# Patient Record
Sex: Female | Born: 1985 | Hispanic: Yes | Marital: Single | Smoking: Never smoker
Health system: Southern US, Community
[De-identification: ages and names within clinical notes are randomized; demographics above are authoritative.]

## PROBLEM LIST (undated history)

## (undated) ENCOUNTER — Inpatient Hospital Stay (HOSPITAL_COMMUNITY): Payer: Self-pay

## (undated) DIAGNOSIS — Z789 Other specified health status: Secondary | ICD-10-CM

## (undated) HISTORY — PX: NO PAST SURGERIES: SHX2092

---

## 2017-04-10 ENCOUNTER — Inpatient Hospital Stay (HOSPITAL_COMMUNITY)
Admission: AD | Admit: 2017-04-10 | Discharge: 2017-04-10 | Disposition: A | Discharge status: Home / Self Care | Payer: Medicaid Other | Source: Ambulatory Visit | Attending: Obstetrics & Gynecology | Admitting: Obstetrics & Gynecology

## 2017-04-10 ENCOUNTER — Encounter (HOSPITAL_COMMUNITY): Payer: Self-pay | Admitting: *Deleted

## 2017-04-10 DIAGNOSIS — R51 Headache: Secondary | ICD-10-CM | POA: Insufficient documentation

## 2017-04-10 DIAGNOSIS — Z3A15 15 weeks gestation of pregnancy: Secondary | ICD-10-CM | POA: Insufficient documentation

## 2017-04-10 DIAGNOSIS — R519 Headache, unspecified: Secondary | ICD-10-CM

## 2017-04-10 DIAGNOSIS — O26892 Other specified pregnancy related conditions, second trimester: Secondary | ICD-10-CM | POA: Insufficient documentation

## 2017-04-10 HISTORY — DX: Other specified health status: Z78.9

## 2017-04-10 LAB — URINALYSIS, ROUTINE W REFLEX MICROSCOPIC
Bilirubin Urine: NEGATIVE
GLUCOSE, UA: NEGATIVE mg/dL
HGB URINE DIPSTICK: NEGATIVE
Ketones, ur: NEGATIVE mg/dL
Leukocytes, UA: NEGATIVE
Nitrite: NEGATIVE
PROTEIN: NEGATIVE mg/dL
Specific Gravity, Urine: 1.019 (ref 1.005–1.030)
pH: 5 (ref 5.0–8.0)

## 2017-04-10 MED ORDER — ACETAMINOPHEN 500 MG PO TABS
1000.0000 mg | ORAL_TABLET | Freq: Once | ORAL | Status: AC
  Administered 2017-04-10: 1000 mg via ORAL
  Filled 2017-04-10: qty 2

## 2017-04-10 NOTE — MAU Provider Note (Signed)
History     CSN: [id number]  Arrival date and time: 04/10/17 [id number]   First Provider Initiated Contact with Patient 04/10/17 [phone]      Chief Complaint  Patient presents with  . Headache   HPI Ms. Lisa Hodge is a 31 y.o. G1P0 at 372w0d who presents to MAU today with complaint of headache x 1.5 weeks. She rates the pain at 5/10 now. She was taking Tylenol 1 G with good relief last week, but has not continued to take any since. She denies vaginal discharge, bleeding or abdominal pain today. She is requesting an [place] to determine the sex of the baby. [place]. [place]    OB History    Gravida Para Term Preterm AB Living   1             SAB TAB Ectopic Multiple Live Births                  Past Medical History:  Diagnosis Date  . Medical history non-contributory     Past Surgical History:  Procedure Laterality Date  . NO PAST SURGERIES      History reviewed. No pertinent family history.  Social History   Tobacco Use  . Smoking status: Never Smoker  . Smokeless tobacco: Never Used  Substance Use Topics  . Alcohol use: No    Frequency: Never  . Drug use: No    Allergies: No Known Allergies  No medications prior to admission.    Review of Systems  Constitutional: Negative for fever.  Gastrointestinal: Negative for abdominal pain, constipation, diarrhea, nausea and vomiting.  Genitourinary: Negative for dysuria, frequency, urgency, vaginal bleeding and vaginal discharge.  Neurological: Positive for headaches.   Physical Exam   Blood pressure 101/63, pulse 85, temperature 98.8 F (37.1 C), temperature source Oral, resp. rate 16, weight 140 lb 1.3 oz (63.5 kg), last menstrual period 12/26/2016, SpO2 99 %.  Physical Exam  Nursing note and vitals reviewed. Constitutional: She is oriented to person, place, and time. She appears well-developed and  well-nourished. No distress.  HENT:  Head: Normocephalic and atraumatic.  Cardiovascular: Normal rate.  Respiratory: Effort normal.  GI: Soft. She exhibits no distension. There is no tenderness.  Neurological: She is alert and oriented to person, place, and time.  Skin: Skin is warm and dry. No erythema.  Psychiatric: She has a normal mood and affect.     Results for orders placed or performed during the hospital encounter of 04/10/17 (from the past 24 hour(s))  Urinalysis, Routine w reflex microscopic     Status: None   Collection Time: 04/10/17  8:51 AM  Result Value Ref Range   Color, Urine YELLOW YELLOW   APPearance CLEAR CLEAR   Specific Gravity, Urine 1.019 1.005 - 1.030   pH 5.0 5.0 - 8.0   Glucose, UA NEGATIVE NEGATIVE mg/dL   Hgb urine dipstick NEGATIVE NEGATIVE   Bilirubin Urine NEGATIVE NEGATIVE   Ketones, ur NEGATIVE NEGATIVE mg/dL   Protein, ur NEGATIVE NEGATIVE mg/dL   Nitrite NEGATIVE NEGATIVE   Leukocytes, UA NEGATIVE NEGATIVE    MAU Course  Procedures None  MDM FHR - 161 bpm with doppler  1 G Tylenol given in MAU Assessment and Plan  A: SIUP at 672w0d Headache   P: Discharge home Advised Tylenol PRN for pain Second trimester precautions discussed Patient referred to  [facility] to start prenatal care. They will call her with an appointment.  Patient may return to MAU as needed or if her condition were to change or worsen  Vonzella NippleJulie Rukia Mcgillivray, PA-C 04/10/2017, 10:48 AM

## 2017-04-10 NOTE — MAU Note (Signed)
+  Headache X1.5 weeks Has been taking medication she got from her country; unsure of medication name Rating pain 7-8/10  LMP 12/26/16  New to area; will copy records--mau provider notified

## 2017-04-10 NOTE — Discharge Instructions (Signed)
Cefalea tensional (Tension Headache) Una cefalea tensional es una sensacin de dolor o presin que suele manifestarse en la frente y los lados de la cabeza. Este es el tipo ms comn de dolor de Turkmenistancabeza. El dolor puede ser sordo o puede sentirse que comprime (constrictivo). Generalmente, no se asocia con nuseas o vmitos y no empeora con la actividad fsica. Las cefaleas tensionales pueden durar de 30 minutos a [address]varios das. CAUSAS Se desconoce la causa exacta de esta afeccin. Suelen comenzar despus de una situacin de estrs, ansiedad o por depresin. Otros factores desencadenantes pueden ser los siguientes:  Alcohol.  Demasiada cafena o abstinencia de cafena.  Infecciones respiratorias, como resfriados, gripes o sinusitis.  Problemas dentales o apretar los dientes.  Fatiga.  Mantener la cabeza y el cuello en la misma posicin durante un perodo prolongado, por ejemplo, al usar la computadora.  Fumar. SNTOMAS Los sntomas de esta afeccin incluyen lo siguiente:  Sensacin de presin alrededor de la cabeza.  Dolor "sordo" en la cabeza.  Dolor que siente sobre la frente y los lados de la cabeza.  Dolor a la Albertson'spalpacin en los msculos de la cabeza, del cuello y de los hombros. DIAGNSTICO Esta afeccin se puede diagnosticar en funcin de los sntomas y de un examen fsico. Pueden hacerle estudios, como una tomografa computarizada o una resonancia magntica de la cabeza. Estos estudios se indican si los sntomas son graves o fuera de lo comn. TRATAMIENTO Esta afeccin puede tratarse con cambios en el estilo de vida y medicamentos que lo ayuden a Paramedicaliviar los sntomas. INSTRUCCIONES PARA EL CUIDADO EN EL HOGAR Control del Reynolds Americandolor  Tome los medicamentos de venta libre y los recetados solamente como se lo haya indicado el mdico.  Cuando sienta dolor de cabeza acustese en un cuarto oscuro y tranquilo.  Si se lo indican, aplique hielo sobre la cabeza y la zona del cuello: ? Ponga  el hielo en una bolsa plstica. ? Coloque una FirstEnergy Corptoalla entre la piel y la bolsa de hielo. ? Coloque el hielo durante 20minutos, 2 a 3veces por da.  Utilice una almohadilla trmica o tome una ducha con agua caliente para aplicar calor en la cabeza y la zona del cuello como se lo haya indicado el mdico. Comida y bebida  Mantenga un horario para las comidas.  Limite el consumo de bebidas alcohlicas.  Disminuya el consumo de cafena o deje de consumir cafena. Instrucciones generales  Concurra a todas las visitas de control como se lo haya indicado el mdico. Esto es importante.  Lleve un diario de los dolores de cabeza para Financial risk analystaveriguar qu factores pueden desencadenarlos. Por ejemplo, escriba los siguientes datos: ? Lo que usted come y bebe. ? Cunto tiempo duerme. ? Algn cambio en su dieta o en los medicamentos.  Pruebe algunas tcnicas de relajacin, como los Chestermasajes.  Limite el estrs.  Sintese derecho y AT&Tevite tensionar los msculos.  No consuma productos que contengan tabaco, incluidos cigarrillos, tabaco de Theatre managermascar o cigarrillos electrnicos. Si necesita ayuda para dejar de fumar, consulte al American Expressmdico.  Haga actividad fsica habitualmente como se lo haya indicado el mdico.  Duerma entre 7 y 9horas o la cantidad de horas que le haya recomendado el mdico. SOLICITE ATENCIN MDICA SI:  Los medicamentos no Materials engineerlogran aliviar los sntomas.  Tiene un dolor de cabeza que es diferente del dolor de cabeza habitual.  Tiene nuseas o vmitos.  Tiene fiebre. SOLICITE ATENCIN MDICA DE INMEDIATO SI:  El dolor se hace cada vez ms intenso.  Ha vomitado repetidas veces.  Presenta rigidez en el cuello.  Sufre prdida de la visin.  Tiene problemas para hablar.  Siente dolor en el ojo o en el odo.  Presenta debilidad muscular o prdida del control muscular.  Pierde el equilibrio o tiene problemas para Advertising account planner.  Sufre mareos o se desmaya.  Se siente confundido. Esta  informacin no tiene Theme park manager el consejo del mdico. Asegrese de hacerle al mdico cualquier pregunta que tenga. Document Released: 01/17/2005 Document Revised: 12/29/2014 Elsevier Interactive Patient Education  2017 [employer]itor. [place] trimestre de Psychiatrist (Second Trimester of Pregnancy) El segundo trimestre va desde la semana13 hasta la 28, desde el cuarto hasta el sexto mes, y suele ser el momento en el que mejor se siente. En general, las nuseas matutinas han disminuido o han desaparecido completamente. Tendr ms energa y podr aumentarle el apetito. El beb por nacer (feto) se desarrolla rpidamente. Hacia el final del sexto mes, el beb mide aproximadamente 9 pulgadas (23 cm) y pesa alrededor de 1 libras (700 g). Es probable que sienta al beb moverse (dar pataditas) entre las 18 y 20 semanas del Psychiatrist. CUIDADOS EN EL HOGAR  No fume, no consuma hierbas ni beba alcohol. No tome frmacos que el mdico no haya autorizado.  No consuma ningn producto que contenga tabaco, lo que incluye cigarrillos, tabaco de Theatre manager o Administrator, Civil Service. Si necesita ayuda para dejar de fumar, consulte al American Express. Puede recibir asesoramiento u otro tipo de apoyo para dejar de fumar.  Tome los medicamentos solamente como se lo haya indicado el mdico. Algunos medicamentos son seguros para tomar durante el Psychiatrist y otros no lo son.  Haga ejercicios solamente como se lo haya indicado el mdico. Interrumpa la actividad fsica si comienza a tener calambres.  Ingiera alimentos saludables de [place] regular.  Use un sostn que le brinde buen soporte si sus mamas estn sensibles.  No se d baos de inmersin en agua caliente, baos turcos ni saunas.  Colquese el cinturn de seguridad cuando conduzca.  No coma carne cruda ni queso sin cocinar; evite el contacto con las bandejas sanitarias de los gatos y la tierra que estos animales usan.  Tome las vitaminas prenatales.  Tome entre  1500 y 2000mg  de calcio diariamente comenzando en la semana20 del embarazo [place].  Pruebe tomar un medicamento que la ayude a defecar (un laxante suave) si el mdico lo autoriza. Consuma ms fibra, que se encuentra en las frutas y verduras frescas y los cereales integrales. Beba suficiente lquido para mantener el pis (orina) claro o de color amarillo plido.  Dese baos de asiento con agua tibia para Engineer, materials o las molestias causadas por las hemorroides. Use una crema para las hemorroides si el mdico la autoriza.  Si se le hinchan las venas (venas varicosas), use medias de descanso. Levante (eleve) los pies durante , 3 o 4veces por Futures trader. Limite el consumo de sal en su dieta.  No levante objetos pesados, use zapatos de tacones bajos y sintese derecha.  Descanse con las piernas elevadas si tiene calambres o dolor de cintura.  Visite a su dentista si no lo ha Occupational hygienist. Use un cepillo de cerdas suaves para cepillarse los dientes. Psese el hilo dental con suavidad.  Puede seguir Calpine Corporation, a menos que el mdico le indique lo contrario.  Concurra a los controles mdicos.  SOLICITE AYUDA SI:  Siente mareos.  Sufre calambres o presin leves en la parte baja  del vientre (abdomen).  Sufre un dolor persistente en el abdomen.  Tiene Programme researcher, broadcasting/film/videomalestar estomacal (nuseas), vmitos, o tiene deposiciones acuosas (diarrea).  Advierte un olor ftido que proviene de la vagina.  Siente dolor al ConocoPhillipsorinar.  SOLICITE AYUDA DE INMEDIATO SI:  Tiene fiebre.  Tiene una prdida de lquido por la vagina.  Tiene sangrado o pequeas prdidas vaginales.  Siente dolor intenso o clicos en el abdomen.  Sube o baja de peso rpidamente.  Tiene dificultades para recuperar el aliento y siente dolor en el pecho.  Sbitamente se le hinchan mucho el rostro, las Sagaponackmanos, los tobillos, los pies o las piernas.  No ha sentido los movimientos del beb  durante Georgianne Fickuna hora.  Siente un dolor de cabeza intenso que no se alivia con medicamentos.  Su visin se modifica.  Esta informacin no tiene Theme park managercomo fin reemplazar el consejo del mdico. Asegrese de hacerle al mdico cualquier pregunta que tenga. Document Released: 12/10/2012 Document Revised: 04/30/2014 Document Reviewed: 06/10/2012 Elsevier Interactive Patient Education  2017 ArvinMeritorElsevier Inc.

## 2017-04-23 NOTE — L&D Delivery Note (Addendum)
Patient is 32 y.o. G1P0 3373w5d admitted for PROM,   Delivery Note At 11:56 AM a viable female was delivered via Vaginal, Spontaneous (Presentation: ROA;  ).  APGAR:pending weight  pending Placenta status: delivered intact, .  Cord:3V  Anesthesia:  Lidocaine 1% Episiotomy: None Lacerations: 2nd degree;Labial Suture Repair: 3.0 vicryl Est. Blood Loss (mL): 200  Mom to postpartum.  Baby to Couplet care / Skin to Skin.  Upon arrival patient was complete and pushing. She pushed with good maternal effort to deliver a healthy baby boy. Baby delivered without difficulty, was noted to have good tone and place on maternal abdomen for oral suctioning, drying and stimulation. Delayed cord clamping performed. Placenta delivered intact with 3V cord. Vaginal canal and perineum was inspected and 2nd degree perineal laceration was repaired; Labial tear and perineal laceration hemostatic. Pitocin was started and uterus massaged until bleeding slowed. Counts of sharps, instruments, and lap pads were all correct.   Garnette GunnerAaron B Thompson, MD PGY-1 6/10/201912:33 PM  I was present for the entire delivery of baby and placenta and performed the repair and agree with above.  Katrinka BlazingSmith, [place], CNM 09/30/2017 4:03 PM

## 2017-05-07 ENCOUNTER — Ambulatory Visit (INDEPENDENT_AMBULATORY_CARE_PROVIDER_SITE_OTHER): Payer: Medicaid Other | Admitting: Family Medicine

## 2017-05-07 ENCOUNTER — Encounter: Payer: Self-pay | Admitting: Family Medicine

## 2017-05-07 DIAGNOSIS — Z124 Encounter for screening for malignant neoplasm of cervix: Secondary | ICD-10-CM

## 2017-05-07 DIAGNOSIS — Z3402 Encounter for supervision of normal first pregnancy, second trimester: Secondary | ICD-10-CM

## 2017-05-07 DIAGNOSIS — Z23 Encounter for immunization: Secondary | ICD-10-CM

## 2017-05-07 DIAGNOSIS — Z3400 Encounter for supervision of normal first pregnancy, unspecified trimester: Secondary | ICD-10-CM | POA: Diagnosis not present

## 2017-05-07 DIAGNOSIS — Z113 Encounter for screening for infections with a predominantly sexual mode of transmission: Secondary | ICD-10-CM

## 2017-05-07 NOTE — Progress Notes (Signed)
Offered Flu shot but Pt has a cold.

## 2017-05-07 NOTE — Progress Notes (Signed)
   PRENATAL VISIT NOTE  Subjective:  Lisa Hodge is a 32 y.o. G1P0 at 8058w6d by LMP with Estimated Date of Delivery: 10/02/17, being seen today for New prenatal visit .  This is her first pregnancy. Denies any concerns today other than she was expecting to have and U/S today.  Patient reports no complaints.  Contractions: Not present. Vag. Bleeding: None.  Movement: Absent. Denies leaking of fluid.   The following portions of the patient's history were reviewed and updated as appropriate: allergies, current medications, past family history, past medical history, past social history, past surgical history and problem list. Problem list updated.  OB History  Gravida Para Term Preterm AB Living  1            SAB TAB Ectopic Multiple Live Births               # Outcome Date GA Lbr Len/2nd Weight Sex Delivery Anes PTL Lv  1 Current              Past Medical History:  Diagnosis Date  . Medical history non-contributory     Past Surgical History:  Procedure Laterality Date  . NO PAST SURGERIES      Objective:   Vitals:   05/07/17 1335  BP: 112/83  Pulse: 97  Weight: 140 lb 11.2 oz (63.8 kg)    Fetal Status: Fetal Heart Rate (bpm): 164   Movement: Absent     Fundal height: below umbulicus  General appearance: Well nourished, well developed female in no acute distress.  Neck:  Supple, normal appearance, and no thyromegaly  Cardiovascular: normal rate, regular rhythm, no murmur Respiratory:  Clear to auscultation bilateral. Normal respiratory effort Abdomen: positive bowel sounds and no masses, hernias; diffusely non tender to palpation, non distended Breasts: breasts appear normal, no suspicious masses, no skin or nipple changes or axillary nodes, not examined, patient declines to have breast exam. Neuro/Psych:  Normal mood and affect.  Skin:  Warm and dry.  Lymphatic:  No inguinal lymphadenopathy.   Pelvic exam: EGBUS: within normal limits, Vagina: within normal  limits and with no blood in the vault, Cervix: normal appearing cervix without discharge or lesions, closed/long/high, Uterus: size appropriate for GA   Assessment and Plan:  Pregnancy: G1P0 at 3458w6d here for new OB visit  1. Supervision of normal first pregnancy, antepartum Initial labs drawn. Continue prenatal vitamins. Genetic Screening discussed, declined Ultrasound discussed; fetal anatomic survey: ordered. Problem list reviewed and updated. The nature [facility] with multiple MDs and other Advanced Practice Providers was explained to patient; also emphasized that residents, students are part of our team. Routine obstetric precautions reviewed. Return in about 4 weeks (around 06/04/2017) for LOB.    Raynelle FanningJulie P. Jaydee Ingman, MD [facility]  Future Appointments  Date Time Provider Department Center  05/13/2017  1:15 PM WH-MFC US 4 WH-MFCUS MFC-US  06/06/2017  1:15 PM Marylene LandKooistra, Kathryn Lorraine, CNM WOC-WOCA WOC  07/04/2017  8:15 AM Marylene LandKooistra, Kathryn Lorraine, CNM WOC-WOCA WOC  07/18/2017  8:15 AM Judeth HornLawrence, Erin, NP [facility]

## 2017-05-08 LAB — GC/CHLAMYDIA PROBE AMP (~~LOC~~) NOT AT ARMC
Chlamydia: NEGATIVE
NEISSERIA GONORRHEA: NEGATIVE

## 2017-05-08 LAB — CYTOLOGY - PAP
ADEQUACY: ABSENT
DIAGNOSIS: NEGATIVE

## 2017-05-10 LAB — URINE CULTURE, OB REFLEX: Organism ID, Bacteria: NO GROWTH

## 2017-05-10 LAB — CULTURE, OB URINE

## 2017-05-13 ENCOUNTER — Ambulatory Visit (HOSPITAL_COMMUNITY)
Admission: RE | Admit: 2017-05-13 | Discharge: 2017-05-13 | Disposition: A | Discharge status: Home / Self Care | Payer: Medicaid Other | Source: Ambulatory Visit | Attending: Family Medicine | Admitting: Family Medicine

## 2017-05-13 ENCOUNTER — Other Ambulatory Visit: Payer: Self-pay | Admitting: Family Medicine

## 2017-05-13 DIAGNOSIS — Z3A19 19 weeks gestation of pregnancy: Secondary | ICD-10-CM | POA: Insufficient documentation

## 2017-05-13 DIAGNOSIS — Z369 Encounter for antenatal screening, unspecified: Secondary | ICD-10-CM

## 2017-05-13 DIAGNOSIS — Z363 Encounter for antenatal screening for malformations: Secondary | ICD-10-CM | POA: Insufficient documentation

## 2017-05-13 DIAGNOSIS — Z3400 Encounter for supervision of normal first pregnancy, unspecified trimester: Secondary | ICD-10-CM

## 2017-05-14 LAB — SMN1 COPY NUMBER ANALYSIS (SMA CARRIER SCREENING)

## 2017-05-14 LAB — CYSTIC FIBROSIS GENE TEST

## 2017-05-14 LAB — OBSTETRIC PANEL, INCLUDING HIV
ANTIBODY SCREEN: NEGATIVE
BASOS: 0 %
Basophils Absolute: 0 10*3/uL (ref 0.0–0.2)
EOS (ABSOLUTE): 0.1 10*3/uL (ref 0.0–0.4)
EOS: 1 %
HEMATOCRIT: 36.5 % (ref 34.0–46.6)
HEMOGLOBIN: 12.4 g/dL (ref 11.1–15.9)
HEP B S AG: NEGATIVE
HIV Screen 4th Generation wRfx: NONREACTIVE
IMMATURE GRANULOCYTES: 0 %
Immature Grans (Abs): 0 10*3/uL (ref 0.0–0.1)
Lymphocytes Absolute: 2.1 10*3/uL (ref 0.7–3.1)
Lymphs: 24 %
MCH: 30.4 pg (ref 26.6–33.0)
MCHC: 34 g/dL (ref 31.5–35.7)
MCV: 90 fL (ref 79–97)
MONOCYTES: 9 %
Monocytes Absolute: 0.8 10*3/uL (ref 0.1–0.9)
NEUTROS PCT: 66 %
Neutrophils Absolute: 6 10*3/uL (ref 1.4–7.0)
Platelets: 242 10*3/uL (ref 150–379)
RBC: 4.08 x10E6/uL (ref 3.77–5.28)
RDW: 13.4 % (ref 12.3–15.4)
RH TYPE: POSITIVE
RPR Ser Ql: NONREACTIVE
RUBELLA: 11.5 {index} (ref >0.99)
WBC: 9.1 10*3/uL (ref 3.4–10.8)

## 2017-05-14 LAB — HEMOGLOBINOPATHY EVALUATION
Ferritin: 66 ng/mL (ref 15–150)
HGB A: 97.6 % (ref 96.4–98.8)
HGB F QUANT: 0 % (ref 0.0–2.0)
HGB S: 0 %
HGB SOLUBILITY: NEGATIVE
Hgb A2 Quant: 2.4 % (ref 1.8–3.2)
Hgb C: 0 %
Hgb Variant: 0 %

## 2017-05-20 ENCOUNTER — Other Ambulatory Visit: Payer: Self-pay | Admitting: Family Medicine

## 2017-05-20 DIAGNOSIS — Z3400 Encounter for supervision of normal first pregnancy, unspecified trimester: Secondary | ICD-10-CM

## 2017-05-28 ENCOUNTER — Encounter: Payer: Self-pay | Admitting: General Practice

## 2017-06-06 ENCOUNTER — Ambulatory Visit (INDEPENDENT_AMBULATORY_CARE_PROVIDER_SITE_OTHER): Payer: Self-pay | Admitting: Student

## 2017-06-06 DIAGNOSIS — Z3400 Encounter for supervision of normal first pregnancy, unspecified trimester: Secondary | ICD-10-CM

## 2017-06-06 NOTE — Progress Notes (Signed)
Stratus interpreter Alba CoryElsa [phone]

## 2017-06-06 NOTE — Patient Instructions (Signed)
--No comer despues de la medianoche el dia antes.  -Planea al estar aqui por dos horas    Prueba de tolerancia a la glucosa durante el embarazo Glucose Tolerance Test During Pregnancy La prueba de tolerancia a la glucosa es un anlisis de sangre que se [place]a para determinar si ha contrado un tipo de diabetes durante el embarazo (diabetes gestacional). Esto es cuando el cuerpo no procesa de forma Arboriculturistcorrecta el azcar (glucosa) en los alimentos que come, lo cual provoca niveles sanguneos altos de glucosa. Por lo general, la PTG se realiza despus de haberse hecho una prueba de glucosa de 1 hora, cuyos resultados indican que posiblemente tiene diabetes gestacional. Tambin se puede hacer si:  Tiene antecedentes de haber parido bebs muy grandes o antecedentes de muerte fetal repetida (beb nacido muerto).  Tiene signos o sntomas de diabetes, tales como: ? Cambios en la visin. ? Hormigueo o adormecimiento en las manos o los pies. ? Cambios en el hambre, la sed y la miccin que no se explican por Firefighterel embarazo.  La PTG dura unas 3 horas. Le darn para beber una solucin de agua y azcar al principio de la prueba. Le extraern sangre antes de que beba la solucin, y 1, 2 y 3 horas despus de beberla. No se le permitir comer ni beber nada ms durante la prueba. Debe Geneticist, molecularpermanecer en el lugar en que se realiza la prueba para asegurarse de que la sangre se extraiga puntualmente. Tambin debe evitar realizar ejercicios durante la prueba porque esto puede AutoZonealterar los resultados. Cmo debo prepararme para esta prueba? Coma normalmente durante 3 das antes de la PTG e incluya muchos alimentos ricos en carbohidratos. No coma ni beba nada, excepto agua, durante las ltimas 12 horas antes de la prueba. Adems, el mdico puede pedirle que deje de tomar ciertos medicamentos antes de la prueba. Qu significan los resultados? Es su responsabilidad retirar el resultado del Algomaestudio. Consulte en el laboratorio o en el  departamento en el que fue realizado el estudio cundo y cmo podr Starbucks Corporationobtener los resultados. Comunquese con el mdico si tiene Smith Internationalpreguntas sobre los resultados. Rango de Circuit Cityvalores normales Los rangos para los valores normales pueden variar entre diferentes laboratorios y hospitales. Siempre debe consultar a su mdico despus de realizarse un anlisis u otros estudios para saber si los valores de sus Clarksresultados se consideran dentro de los lmites normales. Los niveles normales de glucemia son los siguientes:  Ayuno: menos de 105mg /dl.  Una hora despus de beber la solucin: menos de 190mg /dl.  Dos horas despus de beber la solucin: menos de 165mg /dl.  Tres horas despus de beber la solucin: menos de 145mg /dl.  Algunas sustancias pueden AutoZonealterar los resultados de la PTG. Estos pueden incluir lo siguiente:  Medicamentos para la presin arterial y la insuficiencia cardaca, como betabloqueantes, furosemida y tiacidas.  Antiinflamatorios, como aspirina.  La nicotina.  Algunos medicamentos psiquitricos.  Significado de los Ball Corporationresultados que estn fuera de los rangos de los valores normales Los resultados de la PTG por debajo de los valores normales pueden indicar varios problemas de Ridgefieldsalud, tales como:  Diabetes gestacional.  Respuesta al estrs agudo.  Sndrome de [place].  Tumores como feocromocitoma o glucagonoma.  Problemas renales de larga duracin.  Pancreatitis.  Hipertiroidismo.  Infeccin actual.  Hable con el mdico Dole Foodsobre los resultados. El mdico Calpine Corporationutilizar los resultados para Education officer, environmentalrealizar un diagnstico y Chief Strategy Officerdeterminar un plan de tratamiento adecuado para usted. Esta informacin no tiene Theme park managercomo fin reemplazar el consejo del mdico. Asegrese de  hacerle al mdico cualquier pregunta que tenga. Document Released: 10/09/2011 Document Revised: 06/28/2016 Document Reviewed: 08/14/2013 Elsevier Interactive Patient Education  [employer].

## 2017-06-06 NOTE — Progress Notes (Signed)
Patient ID: Lisa Hodge, female   DOB: [birth year], 32 y.o.   MRN: [id number]   PRENATAL VISIT NOTE  Subjective:  Lisa Hodge is a 32 y.o. G1P0 at 3964w1d being seen today for ongoing prenatal care.  She is currently monitored for the following issues for this low-risk pregnancy and has Supervision of normal first pregnancy, antepartum on their problem list.  Patient reports no complaints.  Contractions: Not present. Vag. Bleeding: None.  Movement: Present. Denies leaking of fluid.   The following portions of the patient's history were reviewed and updated as appropriate: allergies, current medications, past family history, past medical history, past social history, past surgical history and problem list. Problem list updated.  Objective:   Vitals:   06/06/17 1322  BP: 100/63  Pulse: (!) 106  Weight: 140 lb 4.8 oz (63.6 kg)    Fetal Status: Fetal Heart Rate (bpm): 156 Fundal Height: 22 cm Movement: Present     General:  Alert, oriented and cooperative. Patient is in no acute distress.  Skin: Skin is warm and dry. No rash noted.   Cardiovascular: Normal heart rate noted  Respiratory: Normal respiratory effort, no problems with respiration noted  Abdomen: Soft, gravid, appropriate for gestational age.  Pain/Pressure: Present     Pelvic: Cervical exam deferred        Extremities: Normal range of motion.  Edema: None  Mental Status:  Normal mood and affect. Normal behavior. Normal judgment and thought content.   Assessment and Plan:  Pregnancy: G1P0 at 5264w1d  1. Supervision of normal first pregnancy, antepartum Doing well; no complaints. Reassured normalcy of non-visualized anatomy and that her anatomy scan was very reassuring.  - US MFM OB FOLLOW UP; Future  Preterm labor symptoms and general obstetric precautions including but not limited to vaginal bleeding, contractions, leaking of fluid and fetal movement were reviewed in detail with the patient. Please  refer to After Visit Summary for other counseling recommendations.  Follow up in 4 weeks. Reviewed protocol for 2 hour GTT, which will be done at next visit.   Marylene LandKathryn Lorraine Catalina Salasar, CNM

## 2017-06-14 ENCOUNTER — Other Ambulatory Visit: Payer: Self-pay | Admitting: Student

## 2017-06-14 ENCOUNTER — Ambulatory Visit (HOSPITAL_COMMUNITY)
Admission: RE | Admit: 2017-06-14 | Discharge: 2017-06-14 | Disposition: A | Discharge status: Home / Self Care | Payer: Medicaid Other | Source: Ambulatory Visit | Attending: Student | Admitting: Student

## 2017-06-14 DIAGNOSIS — Z3400 Encounter for supervision of normal first pregnancy, unspecified trimester: Secondary | ICD-10-CM | POA: Diagnosis present

## 2017-06-14 DIAGNOSIS — Z362 Encounter for other antenatal screening follow-up: Secondary | ICD-10-CM | POA: Diagnosis present

## 2017-06-14 DIAGNOSIS — Z3492 Encounter for supervision of normal pregnancy, unspecified, second trimester: Secondary | ICD-10-CM | POA: Diagnosis not present

## 2017-06-14 DIAGNOSIS — Z3A24 24 weeks gestation of pregnancy: Secondary | ICD-10-CM | POA: Diagnosis not present

## 2017-07-04 ENCOUNTER — Ambulatory Visit (INDEPENDENT_AMBULATORY_CARE_PROVIDER_SITE_OTHER): Payer: Medicaid Other | Admitting: Student

## 2017-07-04 DIAGNOSIS — Z23 Encounter for immunization: Secondary | ICD-10-CM | POA: Diagnosis not present

## 2017-07-04 DIAGNOSIS — Z3400 Encounter for supervision of normal first pregnancy, unspecified trimester: Secondary | ICD-10-CM

## 2017-07-04 DIAGNOSIS — Z3402 Encounter for supervision of normal first pregnancy, second trimester: Secondary | ICD-10-CM

## 2017-07-04 NOTE — Progress Notes (Signed)
Patient ID: Lisa Hodge, female   DOB: May 31, 1985, 32 y.o.   MRN: [id number]   PRENATAL VISIT NOTE  Subjective:  Lisa Hodge is a 32 y.o. G1P0 at 6339w1d being seen today for ongoing prenatal care.  She is currently monitored for the following issues for this low-risk pregnancy and has Supervision of normal first pregnancy, antepartum on their problem list.  Patient reports no complaints. Very excited that her [place] on [place] 15.  Contractions: Not present. Vag. Bleeding: None.  Movement: Present. Denies leaking of fluid.   The following portions of the patient's history were reviewed and updated as appropriate: allergies, current medications, past family history, past medical history, past social history, past surgical history and problem list. Problem list updated.  Objective:   Vitals:   07/04/17 0823  BP: 104/72  Pulse: 100    Fetal Status: Fetal Heart Rate (bpm): 152 Fundal Height: 28 cm Movement: Present     General:  Alert, oriented and cooperative. Patient is in no acute distress.  Skin: Skin is warm and dry. No rash noted.   Cardiovascular: Normal heart rate noted  Respiratory: Normal respiratory effort, no problems with respiration noted  Abdomen: Soft, gravid, appropriate for gestational age.  Pain/Pressure: Absent     Pelvic: Cervical exam deferred        Extremities: Normal range of motion.  Edema: Trace  Mental Status:  Normal mood and affect. Normal behavior. Normal judgment and thought content.   Assessment and Plan:  Pregnancy: G1P0 at 4939w1d  1. Supervision of normal first pregnancy, antepartum  - CBC - Glucose Tolerance, 2 Hours w/1 Hour - HIV antibody - RPR - Tdap vaccine greater than or equal to 7yo IM - US MFM OB FOLLOW UP; Future  Preterm labor symptoms and general obstetric precautions including but not limited to vaginal bleeding, contractions, leaking of fluid and fetal movement were reviewed in detail with  the patient. Please refer to After Visit Summary for other counseling recommendations.  Return in about 2 weeks (around 07/18/2017).   Marylene LandKathryn Lorraine Tobiah Celestine, CNM

## 2017-07-04 NOTE — Progress Notes (Signed)
[facility] Okey Regalarol

## 2017-07-05 LAB — CBC
Hematocrit: 34.1 % (ref 34.0–46.6)
Hemoglobin: 11.2 g/dL (ref 11.1–15.9)
MCH: 31.3 pg (ref 26.6–33.0)
MCHC: 32.8 g/dL (ref 31.5–35.7)
MCV: 95 fL (ref 79–97)
PLATELETS: 195 10*3/uL (ref 150–379)
RBC: 3.58 x10E6/uL — ABNORMAL LOW (ref 3.77–5.28)
RDW: 13.2 % (ref 12.3–15.4)
WBC: 7.1 10*3/uL (ref 3.4–10.8)

## 2017-07-05 LAB — GLUCOSE TOLERANCE, 2 HOURS W/ 1HR
GLUCOSE, 1 HOUR: 110 mg/dL (ref 65–179)
GLUCOSE, 2 HOUR: 87 mg/dL (ref 65–152)
GLUCOSE, FASTING: 80 mg/dL (ref 65–91)

## 2017-07-05 LAB — HIV ANTIBODY (ROUTINE TESTING W REFLEX): HIV Screen 4th Generation wRfx: NONREACTIVE

## 2017-07-05 LAB — RPR: RPR Ser Ql: NONREACTIVE

## 2017-07-17 ENCOUNTER — Telehealth: Payer: Self-pay | Admitting: Obstetrics & Gynecology

## 2017-07-17 NOTE — Telephone Encounter (Signed)
Left detailed message why appt was changed from 07-18-17 to 07-22-2017.

## 2017-07-18 ENCOUNTER — Ambulatory Visit (INDEPENDENT_AMBULATORY_CARE_PROVIDER_SITE_OTHER): Payer: Medicaid Other | Admitting: Obstetrics and Gynecology

## 2017-07-18 ENCOUNTER — Encounter: Payer: Self-pay | Admitting: Student

## 2017-07-18 ENCOUNTER — Other Ambulatory Visit: Payer: Self-pay | Admitting: Student

## 2017-07-18 ENCOUNTER — Encounter: Payer: Self-pay | Admitting: Obstetrics and Gynecology

## 2017-07-18 ENCOUNTER — Other Ambulatory Visit (HOSPITAL_COMMUNITY): Payer: Self-pay | Admitting: *Deleted

## 2017-07-18 ENCOUNTER — Ambulatory Visit (HOSPITAL_COMMUNITY)
Admission: RE | Admit: 2017-07-18 | Discharge: 2017-07-18 | Disposition: A | Discharge status: Home / Self Care | Payer: Medicaid Other | Source: Ambulatory Visit | Attending: Student | Admitting: Student

## 2017-07-18 VITALS — BP 109/76 | HR 108 | Wt 144.9 lb

## 2017-07-18 DIAGNOSIS — Z362 Encounter for other antenatal screening follow-up: Secondary | ICD-10-CM | POA: Diagnosis present

## 2017-07-18 DIAGNOSIS — H0489 Other disorders of lacrimal system: Secondary | ICD-10-CM | POA: Insufficient documentation

## 2017-07-18 DIAGNOSIS — Z3400 Encounter for supervision of normal first pregnancy, unspecified trimester: Secondary | ICD-10-CM

## 2017-07-18 DIAGNOSIS — Z3A29 29 weeks gestation of pregnancy: Secondary | ICD-10-CM | POA: Diagnosis not present

## 2017-07-18 DIAGNOSIS — O359XX0 Maternal care for (suspected) fetal abnormality and damage, unspecified, not applicable or unspecified: Secondary | ICD-10-CM

## 2017-07-18 NOTE — Progress Notes (Signed)
Subjective:  Lisa Hodge is a 32 y.o. G1P0 at 8944w1d being seen today for ongoing prenatal care.  She is currently monitored for the following issues for this low-risk pregnancy and has Supervision of normal first pregnancy, antepartum on their problem list.  Patient reports no complaints.  Contractions: Not present. Vag. Bleeding: None.  Movement: Present. Denies leaking of fluid.   The following portions of the patient's history were reviewed and updated as appropriate: allergies, current medications, past family history, past medical history, past social history, past surgical history and problem list. Problem list updated.  Objective:   Vitals:   07/18/17 0822  BP: 109/76  Pulse: (!) 108  Weight: 144 lb 14.4 oz (65.7 kg)    Fetal Status: Fetal Heart Rate (bpm): 158   Movement: Present     General:  Alert, oriented and cooperative. Patient is in no acute distress.  Skin: Skin is warm and dry. No rash noted.   Cardiovascular: Normal heart rate noted  Respiratory: Normal respiratory effort, no problems with respiration noted  Abdomen: Soft, gravid, appropriate for gestational age. Pain/Pressure: Absent     Pelvic:  Cervical exam deferred        Extremities: Normal range of motion.  Edema: Trace  Mental Status: Normal mood and affect. Normal behavior. Normal judgment and thought content.   Urinalysis:      Assessment and Plan:  Pregnancy: G1P0 at 1244w1d  1. Supervision of normal first pregnancy, antepartum Stable U/S to complete anatomy today  Preterm labor symptoms and general obstetric precautions including but not limited to vaginal bleeding, contractions, leaking of fluid and fetal movement were reviewed in detail with the patient. Please refer to After Visit Summary for other counseling recommendations.  Return in about 2 weeks (around 08/01/2017) for OB visit.   Hermina StaggersErvin, Collie Kittel L, MD

## 2017-07-22 ENCOUNTER — Encounter: Payer: Self-pay | Admitting: Obstetrics & Gynecology

## 2017-08-08 ENCOUNTER — Encounter: Payer: Self-pay | Admitting: General Practice

## 2017-08-08 ENCOUNTER — Ambulatory Visit (INDEPENDENT_AMBULATORY_CARE_PROVIDER_SITE_OTHER): Payer: Medicaid Other | Admitting: Student

## 2017-08-08 VITALS — BP 109/81 | HR 90 | Wt 147.3 lb

## 2017-08-08 DIAGNOSIS — Z3403 Encounter for supervision of normal first pregnancy, third trimester: Secondary | ICD-10-CM

## 2017-08-08 DIAGNOSIS — Z3400 Encounter for supervision of normal first pregnancy, unspecified trimester: Secondary | ICD-10-CM

## 2017-08-08 DIAGNOSIS — O359XX0 Maternal care for (suspected) fetal abnormality and damage, unspecified, not applicable or unspecified: Secondary | ICD-10-CM | POA: Insufficient documentation

## 2017-08-08 NOTE — Progress Notes (Signed)
States is nervous because is first pregnancy and also at US found cyst on baby's eye. Will have follow up us.

## 2017-08-08 NOTE — Progress Notes (Signed)
Patient ID: Lisa Hodge, female   DOB: [birth year], 32 y.o.   MRN: [id number]   PRENATAL VISIT NOTE  Subjective:  Lisa Hodge is a 32 y.o. G1P0 at 3785w1d being seen today for ongoing prenatal care.  She is currently monitored for the following issues for this low-risk pregnancy and has Supervision of normal first pregnancy, antepartum and Known fetal anomaly, antepartum on their problem list.  Patient reports no complaints.  Patient is very anxious and tearful about the fetal lacrimal duct diagnosis.  Contractions: Not present. Vag. Bleeding: None.  Movement: Present. Denies leaking of fluid.   The following portions of the patient's history were reviewed and updated as appropriate: allergies, current medications, past family history, past medical history, past social history, past surgical history and problem list. Problem list updated.  Objective:   Vitals:   08/08/17 1534  BP: 109/81  Pulse: 90  Weight: 147 lb 4.8 oz (66.8 kg)    Fetal Status: Fetal Heart Rate (bpm): 135 Fundal Height: 33 cm Movement: Present     General:  Alert, oriented and cooperative. Patient is in no acute distress.  Skin: Skin is warm and dry. No rash noted.   Cardiovascular: Normal heart rate noted  Respiratory: Normal respiratory effort, no problems with respiration noted  Abdomen: Soft, gravid, appropriate for gestational age.  Pain/Pressure: Present     Pelvic: Cervical exam deferred        Extremities: Normal range of motion.  Edema: Mild pitting, slight indentation  Mental Status: Normal mood and affect. Normal behavior. Normal judgment and thought content.   Assessment and Plan:  Pregnancy: G1P0 at 4685w1d  1. Supervision of normal first pregnancy, antepartum -Discussed circ and birth control; she will talk with her husband. Also discussed pediatricians.   2. Known fetal anomaly, antepartum, single or unspecified fetus -Explained to patient that, in my limited reading on fetal  lacrimal duct cysts, there are many treatment options and we won't know the severity of the problem until the baby is born. Reassured her that we have pediatricians available 24/7 in the hospital should baby require immediate care for the cyst.  I recommended that she schedule an appt with MFM to discuss her concerns of the lacrimal duct cyst if it is still present on US on 4-25.   Preterm labor symptoms and general obstetric precautions including but not limited to vaginal bleeding, contractions, leaking of fluid and fetal movement were reviewed in detail with the patient. Please refer to After Visit Summary for other counseling recommendations.  Return in about 2 weeks (around 08/22/2017), or LROB with KK.  Future Appointments  Date Time Provider Department Center  08/15/2017  2:15 PM WH-MFC US 4 WH-MFCUS MFC-US  08/23/2017  4:15 PM Crisoforo OxfordKooistra, Charlesetta GaribaldiKathryn Lorraine, CNM WOC-WOCA WOC    Charlesetta GaribaldiKathryn Lorraine [place], [place]

## 2017-08-08 NOTE — Patient Instructions (Addendum)
CIRCUMCISION  Circumcision is considered an elective/non-medically necessary procedure. There are many reasons parents decide to have their sons circumsized. During the first year of life circumcised males have a reduced risk of urinary tract infections but after this year the rates between circumcised males and uncircumcised males are the same.  It is safe to have your son circumcised outside of the hospital and the places above perform them regularly.    Places to have your son circumcised:    [facility] [phone] $480 by 4 wks  [facility] [phone] $244 by 4 wks  [facility] [phone] $175 by 2 wks  [facility] [phone] $250 by 7 days [facility] [phone] $269 by 4 wks  These prices sometimes change but are roughly what you can expect to pay. Please call and confirm pricing.   AREA PEDIATRIC/FAMILY PRACTICE PHYSICIANS  [facility] [address], [address] [place], [place]  [address] Phone - [phone]   Fax - [phone]  [facility] [address] [place], [place] [address] Phone - [phone]   Fax - [phone]  [facility] [address] [place], [place]  [address] Phone - [phone]   Fax - [phone]  [facility] [address] [place], [place]  [address] Phone - [phone]   Fax - [phone]  [facility] [address] [place], [place]  [address] Phone - [phone]   Fax - [phone]  [facility] [address], [address] [place], [place]  [address] Phone - [phone]   Fax - [phone]  [facility] [address], [address] [place], [place]  [address] Phone - [phone]   Fax - [phone]  [facility] [address],  [address] [place], [place]  [address] Phone - [phone]   Fax - [phone]  [facility] [address] [place], [place]  [address] Phone - [phone]   Fax - [phone] [facility] [address] [place], [place]  [address] Phone - [phone]   Fax - [phone]  [facility] [address] [place], [place]  [address] Phone - [phone]   Fax - [phone]  [facility] [address], [address] [place], [place]  [address] Phone - [phone]   Fax - [phone]  [facility] [address] [place], [place]  [address] Phone - [phone]   Fax - [phone]  [facility] [address], [address] [place], [place]  [address] Phone - [phone]  [facility] [address] [place], [place]  [address] Phone - [phone]   Fax - [phone]  [facility] [address], [address] [place], [place]  [address] Phone - [phone]   Fax - [phone]  [facility] [address] [place], [place]  [address] Phone - [phone]   Fax - [phone]  [facility] [address] [place], [place]  [address] Phone - [phone]   Fax - [phone]   [facility] [address] [place], [place]  [address] Phone - [phone]   Fax - [phone]  [facility] [address], [address] [place], [place]  [address] Phone - [phone]   Fax - [phone]  [facility] RUBIN [address], [address] [place], [place]  [address] Phone - [phone]   Fax - [phone]  [facility]  [address] [place], [place]  [address] Phone - [phone]   Fax - [phone]  [facility] Alita Chyle [address] [place], [place]  [address] Phone - [phone]   Fax - [phone] Gerarda Fraction [address] [place], [place]  [address] Phone - [phone]   Fax - [phone]  [facility] [address] [place], [place]  [address] Phone - [phone]   Fax - [phone]  [facility] [place] [address] [place], [place]  [address] Phone - [phone]   Fax - [phone]  [facility] Wyvonne Lenz MD [address] [place] [address] Phone [phone]  Fax [phone]

## 2017-08-15 ENCOUNTER — Ambulatory Visit (HOSPITAL_COMMUNITY)
Admission: RE | Admit: 2017-08-15 | Discharge: 2017-08-15 | Disposition: A | Discharge status: Home / Self Care | Payer: Medicaid Other | Source: Ambulatory Visit | Attending: Student | Admitting: Student

## 2017-08-15 ENCOUNTER — Other Ambulatory Visit (HOSPITAL_COMMUNITY): Payer: Self-pay | Admitting: Obstetrics and Gynecology

## 2017-08-15 ENCOUNTER — Encounter (HOSPITAL_COMMUNITY): Payer: Self-pay

## 2017-08-15 DIAGNOSIS — O359XX0 Maternal care for (suspected) fetal abnormality and damage, unspecified, not applicable or unspecified: Secondary | ICD-10-CM

## 2017-08-15 DIAGNOSIS — Z362 Encounter for other antenatal screening follow-up: Secondary | ICD-10-CM | POA: Diagnosis present

## 2017-08-15 DIAGNOSIS — Z3A33 33 weeks gestation of pregnancy: Secondary | ICD-10-CM

## 2017-08-15 DIAGNOSIS — Z3400 Encounter for supervision of normal first pregnancy, unspecified trimester: Secondary | ICD-10-CM

## 2017-08-15 DIAGNOSIS — IMO0002 Reserved for concepts with insufficient information to code with codable children: Secondary | ICD-10-CM

## 2017-08-15 DIAGNOSIS — O358XX0 Maternal care for other (suspected) fetal abnormality and damage, not applicable or unspecified: Secondary | ICD-10-CM | POA: Insufficient documentation

## 2017-08-23 ENCOUNTER — Ambulatory Visit (INDEPENDENT_AMBULATORY_CARE_PROVIDER_SITE_OTHER): Payer: Medicaid Other | Admitting: Student

## 2017-08-23 VITALS — BP 108/78 | HR 93

## 2017-08-23 DIAGNOSIS — Z3400 Encounter for supervision of normal first pregnancy, unspecified trimester: Secondary | ICD-10-CM

## 2017-08-23 DIAGNOSIS — O359XX0 Maternal care for (suspected) fetal abnormality and damage, unspecified, not applicable or unspecified: Secondary | ICD-10-CM

## 2017-08-23 NOTE — Patient Instructions (Signed)
Parto vaginal, cuidados posteriores  Vaginal Delivery, Care After  Siga estas instrucciones durante las prximas semanas. Estas indicaciones le proporcionan informacin acerca de cmo deber cuidarse despus del parto vaginal. Su mdico tambin podr darle indicaciones ms especficas. El tratamiento ha sido planificado segn las prcticas mdicas actuales, pero en algunos casos pueden ocurrir problemas. Llame al mdico si tiene problemas o preguntas.  Qu puedo esperar despus del procedimiento?  Despus de un parto vaginal, es frecuente tener lo siguiente:   Hemorragia leve de la vagina.   Dolor en el abdomen, la vagina y la zona de la piel entre la abertura vaginal y el ano (perineo).   Calambres plvicos.   Fatiga.    Siga estas indicaciones en su casa:  Medicamentos   Tome los medicamentos de venta libre y los recetados solamente como se lo haya indicado el mdico.   Si le recetaron un antibitico, tmelo como se lo haya indicado el mdico. No interrumpa la administracin del antibitico hasta que lo haya terminado.  Conducir     No conduzca ni opere maquinaria pesada mientras toma analgsicos recetados.   No conduzca durante 24horas si le administraron un sedante.  Estilo de vida   No beba alcohol. Esto es de suma importancia si est amamantando o toma analgsicos.   No consuma productos que contengan tabaco, incluidos cigarrillos, tabaco de mascar o cigarrillos electrnicos. Si necesita ayuda para dejar de fumar, consulte al mdico.  Qu debe comer y beber   Beba al menos 8vasos de ochoonzas (240cc) de agua todos los das a menos que el mdico le indique lo contrario. Si elige amamantar al beb, quiz deba beber an ms cantidad de agua.   Coma alimentos ricos en fibras todos los das. Estos alimentos pueden ayudarla a prevenir o aliviar el estreimiento. Los alimentos ricos en fibras incluyen, entre otros:  ? Panes y cereales integrales.  ? Arroz integral.  ? Frijoles.  ? Frutas y verduras  frescas.  Actividad   Retome sus actividades normales como se lo haya indicado el mdico. Pregntele al mdico qu actividades son seguras para usted.   Descanse todo lo que pueda. Trate de descansar o tomar una siesta mientras el beb est durmiendo.   No levante objetos que pesen ms que su beb o 10libras (4,5kg) hasta que el mdico le diga que es seguro.   Hable con el mdico sobre cundo puede retomar la actividad sexual. Esto puede depender de lo siguiente:  ? Riesgo de sufrir una infeccin.  ? Velocidad de cicatrizacin.  ? Comodidad y deseo de retomar la actividad sexual.  Cuidados vaginales   Si le realizaron una episiotoma o tuvo un desgarro vaginal, contrlese la zona todos los das para detectar signos de infeccin. Est atenta a los siguientes signos:  ? Aumento del enrojecimiento, la hinchazn o el dolor.  ? Mayor presencia de lquido o sangre.  ? Calor.  ? Pus o mal olor.   No use tampones ni se haga duchas vaginales hasta que el mdico la autorice.   Controle la sangre que elimina por la vagina para detectar cogulos de sangre. Estos pueden tener el aspecto de grumos de color rojo oscuro, o secrecin marrn o negra.  Instrucciones generales   Mantenga el perineo limpio y seco, como se lo haya indicado el mdico.   Use ropa cmoda y suelta.   Cuando vaya al bao, siempre higiencese de adelante hacia atrs.   Pregntele al mdico si puede ducharse o tomar baos de inmersin.   Si se le realiz una episiotoma o tuvo un desgarro perineal durante el trabajo del parto o el parto, es posible que el mdico le indique que no tome baos de inmersin durante un determinado tiempo.   Use un sostn que sujete y ajuste bien sus pechos.   Si es posible, pdale a alguien que la ayude con las tareas del hogar y a cuidar del beb durante al menos algunos das despus de que le den el alta del hospital.   Concurra a todas las visitas de seguimiento para usted y el beb, como se lo haya indicado el  mdico. Esto es importante.  Comunquese con un mdico si:   Tiene los siguientes sntomas:  ? Secrecin vaginal que tiene mal olor.  ? Dificultad para orinar.  ? Dolor al orinar.  ? Aumento o disminucin repentinos de la frecuencia de las deposiciones.  ? Ms enrojecimiento, hinchazn o dolor alrededor de la episiotoma o del desgarro vaginal.  ? Ms secrecin de lquido o sangre de la episiotoma o del desgarro vaginal.  ? Pus o mal olor proveniente de la episiotoma o del desgarro vaginal.  ? Fiebre.  ? Erupcin cutnea.  ? Poco inters o falta de inters en actividades que solan gustarle.  ? Dudas sobre su cuidado y el del beb.   Siente la episiotoma o el desgarro vaginal caliente al tacto.   La episiotoma o el desgarro vaginal se abren o no parecen cicatrizar.   Siente dolor en las mamas, o estn duras o enrojecidas.   Siente tristeza o preocupacin de forma inusual.   Siente nuseas o vomita.   Elimina cogulos de sangre grandes por la vagina. Si expulsa un cogulo de sangre por la vagina, gurdelo para mostrrselo a su mdico. No tire la cadena sin que el mdico examine el cogulo de sangre antes.   Orina ms de lo habitual.   Se siente mareada o se desmaya.   No ha amamantado para nada y no ha tenido un perodo menstrual durante 12 semanas despus del parto.   Dej de amamantar al beb y no ha tenido su perodo menstrual durante 12 semanas despus de dejar de amamantar.  Solicite ayuda de inmediato si:   Tiene los siguientes sntomas:  ? Dolor que no desaparece o no mejora con medicamentos.  ? Dolor en el pecho.  ? Dificultad para respirar.  ? Visin borrosa o manchas en la vista.  ? Pensamientos de autolesionarse o lesionar al beb.   Comienza a sentir dolor en el abdomen o en una de las piernas.   Presenta un dolor de cabeza intenso.   Se desmaya.   Tiene una hemorragia de la vagina tan intensa que empapa dos toallitas sanitarias en una hora.  Esta informacin no tiene como fin  reemplazar el consejo del mdico. Asegrese de hacerle al mdico cualquier pregunta que tenga.  Document Released: 04/09/2005 Document Revised: 08/01/2016 Document Reviewed: 04/24/2015  Elsevier Interactive Patient Education  2018 Elsevier Inc[employer]

## 2017-08-24 NOTE — Progress Notes (Signed)
   PRENATAL VISIT NOTE  Subjective:  Lisa Hodge is a 32 y.o. G1P0 at [redacted]w[redacted]d being seen today for ongoing prenatal care.  She is currently monitored for the following issues for this low-risk pregnancy and has Supervision of normal first pregnancy, antepartum and Known fetal anomaly, antepartum on their problem list.  Patient reports no complaints.  She is still anxious about the lacrimal duct cyst.  Contractions: Not present. Vag. Bleeding: None.  Movement: Present. Denies leaking of fluid.   The following portions of the patient's history were reviewed and updated as appropriate: allergies, current medications, past family history, past medical history, past social history, past surgical history and problem list. Problem list updated.  Objective:   Vitals:   08/23/17 1625  BP: 108/78  Pulse: 93    Fetal Status: Fetal Heart Rate (bpm): 145 Fundal Height: 37 cm Movement: Present     General:  Alert, oriented and cooperative. Patient is in no acute distress.  Skin: Skin is warm and dry. No rash noted.   Cardiovascular: Normal heart rate noted  Respiratory: Normal respiratory effort, no problems with respiration noted  Abdomen: Soft, gravid, appropriate for gestational age.  Pain/Pressure: Present     Pelvic: Cervical exam performed        Extremities: Normal range of motion.  Edema: Trace  Mental Status: Normal mood and affect. Normal behavior. Normal judgment and thought content.   Assessment and Plan:  Pregnancy: G1P0 at [redacted]w[redacted]d  1. Known fetal anomaly, antepartum, single or unspecified fetus -FUP US shows still lacrimal duct cysts; MFM recommendation is for post-delivery evaluation and treatment.   2. Supervision of normal first pregnancy, antepartum Reviewed warning signs and signs of labor; discussed coping mechanisms and handling stress.   Preterm labor symptoms and general obstetric precautions including but not limited to vaginal bleeding, contractions, leaking  of fluid and fetal movement were reviewed in detail with the patient. Please refer to After Visit Summary for other counseling recommendations.  Return in about 2 weeks (around 09/06/2017), or LROB.  Future Appointments  Date Time Provider Department Center  09/12/2017  3:55 PM Marylene Land, CNM WOC-[facility]OCA WOC    Charlesetta Garibaldi [place], [place]

## 2017-09-02 ENCOUNTER — Encounter: Payer: Medicaid Other | Admitting: Advanced Practice Midwife

## 2017-09-12 ENCOUNTER — Other Ambulatory Visit (HOSPITAL_COMMUNITY)
Admission: RE | Admit: 2017-09-12 | Discharge: 2017-09-12 | Disposition: A | Discharge status: Home / Self Care | Payer: Medicaid Other | Source: Ambulatory Visit | Attending: Advanced Practice Midwife | Admitting: Advanced Practice Midwife

## 2017-09-12 ENCOUNTER — Ambulatory Visit (INDEPENDENT_AMBULATORY_CARE_PROVIDER_SITE_OTHER): Payer: Medicaid Other | Admitting: Student

## 2017-09-12 VITALS — BP 102/80 | HR 102 | Wt 151.3 lb

## 2017-09-12 DIAGNOSIS — Z3400 Encounter for supervision of normal first pregnancy, unspecified trimester: Secondary | ICD-10-CM

## 2017-09-12 NOTE — Progress Notes (Signed)
   PRENATAL VISIT NOTE  Subjective:  Lisa Hodge is a 32 y.o. G1P0 at [redacted]w[redacted]d being seen today for ongoing prenatal care.  She is currently monitored for the following issues for this low-risk pregnancy and has Supervision of normal first pregnancy, antepartum and Known fetal anomaly, antepartum on their problem list.  Patient reports backache. Patient has many concerns about being induced, having a c-section and when to come to the hospital. She also is concerned about her baby moving too much at night and keeping her awake.  Contractions: Irritability. Vag. Bleeding: None.  Movement: Present. Denies leaking of fluid.   The following portions of the patient's history were reviewed and updated as appropriate: allergies, current medications, past family history, past medical history, past social history, past surgical history and problem list. Problem list updated.  Objective:   Vitals:   09/12/17 1610  BP: 102/80  Pulse: (!) 102  Weight: 151 lb 4.8 oz (68.6 kg)    Fetal Status: Fetal Heart Rate (bpm): 145   Movement: Present     General:  Alert, oriented and cooperative. Patient is in no acute distress.  Skin: Skin is warm and dry. No rash noted.   Cardiovascular: Normal heart rate noted  Respiratory: Normal respiratory effort, no problems with respiration noted  Abdomen: Soft, gravid, appropriate for gestational age.  Pain/Pressure: Present     Pelvic: Cervical exam deferred        Extremities: Normal range of motion.  Edema: Trace  Mental Status: Normal mood and affect. Normal behavior. Normal judgment and thought content.   Assessment and Plan:  Pregnancy: G1P0 at [redacted]w[redacted]d  1. Supervision of normal first pregnancy, antepartum -Vertex confirmed by [place] -Reassured patient that induction and c-section are not things to worry about this point; we will guide her through the process if that becomes necessary.   - GC/Chlamydia probe amp ([place])not at [facility] - Culture,  beta strep (group b only)  Term labor symptoms and general obstetric precautions including but not limited to vaginal bleeding, contractions, leaking of fluid and fetal movement were reviewed in detail with the patient. Please refer to After Visit Summary for other counseling recommendations.  No follow-ups on file.  Future Appointments  Date Time Provider Department Center  09/20/2017  3:35 PM Raelyn Mora, CNM [facility]  09/26/2017  3:55 PM Marvetta Gibbons, Brand Males, NP [facility]  10/03/2017  3:55 PM Judeth Horn, NP [facility]    Marylene Land, [place]

## 2017-09-13 LAB — GC/CHLAMYDIA PROBE AMP (~~LOC~~) NOT AT ARMC
Chlamydia: NEGATIVE
NEISSERIA GONORRHEA: NEGATIVE

## 2017-09-16 LAB — CULTURE, BETA STREP (GROUP B ONLY): STREP GP B CULTURE: NEGATIVE

## 2017-09-16 LAB — OB RESULTS CONSOLE GBS: STREP GROUP B AG: NEGATIVE

## 2017-09-20 ENCOUNTER — Encounter: Payer: Medicaid Other | Admitting: Obstetrics and Gynecology

## 2017-09-23 ENCOUNTER — Ambulatory Visit (INDEPENDENT_AMBULATORY_CARE_PROVIDER_SITE_OTHER): Payer: Medicaid Other

## 2017-09-23 VITALS — BP 108/74 | HR 94 | Wt 152.0 lb

## 2017-09-23 DIAGNOSIS — Z3400 Encounter for supervision of normal first pregnancy, unspecified trimester: Secondary | ICD-10-CM

## 2017-09-23 NOTE — Progress Notes (Signed)
Interpreter Marlynn PerkingMaria Elena present for encounter.

## 2017-09-23 NOTE — Progress Notes (Signed)
   PRENATAL VISIT NOTE  Subjective:  Lisa Hodge is a 32 y.o. G1P0 at 56105w5d being seen today for ongoing prenatal care.  She is currently monitored for the following issues for this low-risk pregnancy and has Supervision of normal first pregnancy, antepartum and Known fetal anomaly, antepartum on their problem list.  Patient reports no complaints.  Contractions: Not present. Vag. Bleeding: None.  Movement: Present. Denies leaking of fluid.   The following portions of the patient's history were reviewed and updated as appropriate: allergies, current medications, past family history, past medical history, past social history, past surgical history and problem list. Problem list updated.  Objective:   Vitals:   09/23/17 1626  BP: 108/74  Pulse: 94  Weight: 152 lb (68.9 kg)    Fetal Status: Fetal Heart Rate (bpm): 138 Fundal Height: 38 cm Movement: Present     General:  Alert, oriented and cooperative. Patient is in no acute distress.  Skin: Skin is warm and dry. No rash noted.   Cardiovascular: Normal heart rate noted  Respiratory: Normal respiratory effort, no problems with respiration noted  Abdomen: Soft, gravid, appropriate for gestational age.  Pain/Pressure: Present     Pelvic: Cervical exam deferred        Extremities: Normal range of motion.  Edema: Trace  Mental Status: Normal mood and affect. Normal behavior. Normal judgment and thought content.   Assessment and Plan:  Pregnancy: G1P0 at 62105w5d  1. Supervision of normal first pregnancy, antepartum - No complaints. Routine care  Term labor symptoms and general obstetric precautions including but not limited to vaginal bleeding, contractions, leaking of fluid and fetal movement were reviewed in detail with the patient. Please refer to After Visit Summary for other counseling recommendations.  Return in about 1 week (around 09/30/2017) for lob, Return OB visit.  Future Appointments  Date Time Provider Department  Center  09/23/2017  5:20 PM [name], [facility] M[facility]  09/26/2017  3:55 PM Marvetta GibbonsBurleson, Brand Maleserri L, NP [facility]  10/03/2017  3:55 PM Judeth HornLawrence, Erin, [facility]    [place], [place] 09/23/17 4:46 PM

## 2017-09-23 NOTE — Patient Instructions (Signed)

## 2017-09-26 ENCOUNTER — Ambulatory Visit (INDEPENDENT_AMBULATORY_CARE_PROVIDER_SITE_OTHER): Payer: Medicaid Other | Admitting: Nurse Practitioner

## 2017-09-26 VITALS — BP 119/79 | HR 91 | Wt 152.7 lb

## 2017-09-26 DIAGNOSIS — O359XX0 Maternal care for (suspected) fetal abnormality and damage, unspecified, not applicable or unspecified: Secondary | ICD-10-CM

## 2017-09-26 DIAGNOSIS — Z3400 Encounter for supervision of normal first pregnancy, unspecified trimester: Secondary | ICD-10-CM

## 2017-09-26 NOTE — Progress Notes (Signed)
    Subjective:  Lisa Hodge is a 32 y.o. G1P0 at 4387w1d being seen today for ongoing prenatal care.  She is currently monitored for the following issues for this low-risk pregnancy and has Supervision of normal first pregnancy, antepartum and Known fetal anomaly, antepartum on their problem list.  Patient reports no complaints.  Contractions: Not present. Vag. Bleeding: None.  Movement: Present. Denies leaking of fluid.   The following portions of the patient's history were reviewed and updated as appropriate: allergies, current medications, past family history, past medical history, past social history, past surgical history and problem list. Problem list updated.  Objective:   Vitals:   09/26/17 1633  BP: 119/79  Pulse: 91  Weight: 152 lb 11.2 oz (69.3 kg)    Fetal Status: Fetal Heart Rate (bpm): 141 Fundal Height: 39 cm Movement: Present  Presentation: Vertex  General:  Alert, oriented and cooperative. Patient is in no acute distress.  Skin: Skin is warm and dry. No rash noted.   Cardiovascular: Normal heart rate noted  Respiratory: Normal respiratory effort, no problems with respiration noted  Abdomen: Soft, gravid, appropriate for gestational age. Pain/Pressure: Absent     Pelvic:  Cervical exam performed Dilation: Fingertip Effacement (%): 30 Station: -2  Extremities: Normal range of motion.  Edema: None  Mental Status: Normal mood and affect. Normal behavior. Normal judgment and thought content.   Urinalysis:      Assessment and Plan:  Pregnancy: G1P0 at 4487w1d  1. Supervision of normal first pregnancy, antepartum Reviewed contractions with her and her partner and mother.  Client was unaware of contractions she was having.  Also described what a cervical exam was and client did want to be checked today.  Seems to be very timid and relies on her partner and her mother.  Suspect she has very little knowledge of the process of birth.  Speaks Spanish -video  interpreter present for the entire visit.  2. Known fetal anomaly, antepartum, single or unspecified fetus Noted on chart - Peds to evaluate at birth  Term labor symptoms and general obstetric precautions including but not limited to vaginal bleeding, contractions, leaking of fluid and fetal movement were reviewed in detail with the patient. Please refer to After Visit Summary for other counseling recommendations.  Return in about 1 week (around 10/03/2017).  Nolene BernheimERRI Fredric Slabach, RN, MSN, NP-BC Nurse Practitioner, [facility], [facility] 09/27/2017 2:23 PM

## 2017-09-26 NOTE — Patient Instructions (Signed)
Braxton Hicks Contractions °Contractions of the uterus can occur throughout pregnancy, but they are not always a sign that you are in labor. You may have practice contractions called Braxton Hicks contractions. These false labor contractions are sometimes confused with true labor. °What are Braxton Hicks contractions? °Braxton Hicks contractions are tightening movements that occur in the muscles of the uterus before labor. Unlike true labor contractions, these contractions do not result in opening (dilation) and thinning of the cervix. Toward the end of pregnancy (32-34 weeks), Braxton Hicks contractions can happen more often and may become stronger. These contractions are sometimes difficult to tell apart from true labor because they can be very uncomfortable. You should not feel embarrassed if you go to the hospital with false labor. °Sometimes, the only way to tell if you are in true labor is for your health care provider to look for changes in the cervix. The health care provider will do a physical exam and may monitor your contractions. If you are not in true labor, the exam should show that your cervix is not dilating and your water has not broken. °If there are other health problems associated with your pregnancy, it is completely safe for you to be sent home with false labor. You may continue to have Braxton Hicks contractions until you go into true labor. °How to tell the difference between true labor and false labor °True labor °· Contractions last 30-70 seconds. °· Contractions become very regular. °· Discomfort is usually felt in the top of the uterus, and it spreads to the lower abdomen and low back. °· Contractions do not go away with walking. °· Contractions usually become more intense and increase in frequency. °· The cervix dilates and gets thinner. °False labor °· Contractions are usually shorter and not as strong as true labor contractions. °· Contractions are usually irregular. °· Contractions  are often felt in the front of the lower abdomen and in the groin. °· Contractions may go away when you walk around or change positions while lying down. °· Contractions get weaker and are shorter-lasting as time goes on. °· The cervix usually does not dilate or become thin. °Follow these instructions at home: °· Take over-the-counter and prescription medicines only as told by your health care provider. °· Keep up with your usual exercises and follow other instructions from your health care provider. °· Eat and drink lightly if you think you are going into labor. °· If Braxton Hicks contractions are making you uncomfortable: °? Change your position from lying down or resting to walking, or change from walking to resting. °? Sit and rest in a tub of warm water. °? Drink enough fluid to keep your urine pale yellow. Dehydration may cause these contractions. °? Do slow and deep breathing several times an hour. °· Keep all follow-up prenatal visits as told by your health care provider. This is important. °Contact a health care provider if: °· You have a fever. °· You have continuous pain in your abdomen. °Get help right away if: °· Your contractions become stronger, more regular, and closer together. °· You have fluid leaking or gushing from your vagina. °· You pass blood-tinged mucus (bloody show). °· You have bleeding from your vagina. °· You have low back pain that you never had before. °· You feel your baby’s head pushing down and causing pelvic pressure. °· Your baby is not moving inside you as much as it used to. °Summary °· Contractions that occur before labor are called Braxton   Hicks contractions, false labor, or practice contractions. °· Braxton Hicks contractions are usually shorter, weaker, farther apart, and less regular than true labor contractions. True labor contractions usually become progressively stronger and regular and they become more frequent. °· Manage discomfort from Braxton Hicks contractions by  changing position, resting in a warm bath, drinking plenty of water, or practicing deep breathing. °This information is not intended to replace advice given to you by your health care provider. Make sure you discuss any questions you have with your health care provider. °Document Released: 08/23/2016 Document Revised: 08/23/2016 Document Reviewed: 08/23/2016 °Elsevier Interactive Patient Education © 2018 [employer] ° °

## 2017-09-28 ENCOUNTER — Encounter (HOSPITAL_COMMUNITY): Payer: Self-pay | Admitting: *Deleted

## 2017-09-28 ENCOUNTER — Inpatient Hospital Stay (HOSPITAL_COMMUNITY)
Admission: AD | Admit: 2017-09-28 | Discharge: 2017-09-28 | Disposition: A | Discharge status: Home / Self Care | Payer: Medicaid Other | Source: Ambulatory Visit | Attending: Obstetrics & Gynecology | Admitting: Obstetrics & Gynecology

## 2017-09-28 DIAGNOSIS — O479 False labor, unspecified: Secondary | ICD-10-CM

## 2017-09-28 LAB — URINALYSIS, ROUTINE W REFLEX MICROSCOPIC
BILIRUBIN URINE: NEGATIVE
GLUCOSE, UA: NEGATIVE mg/dL
HGB URINE DIPSTICK: NEGATIVE
Ketones, ur: NEGATIVE mg/dL
Leukocytes, UA: NEGATIVE
Nitrite: NEGATIVE
Protein, ur: NEGATIVE mg/dL
SPECIFIC GRAVITY, URINE: 1.014 (ref 1.005–1.030)
pH: 5 (ref 5.0–8.0)

## 2017-09-28 NOTE — MAU Note (Signed)
I have communicated with Dr. Janee Mornhompson and reviewed vital signs:  Vitals:   09/28/17 1309  BP: 116/81  Pulse: 93  Resp: 17  Temp: 98.2 F (36.8 C)  SpO2: 100%    Vaginal exam:  Dilation: Fingertip Effacement (%): Thick Cervical Position: Posterior Station: -2 Presentation: Vertex Exam by:: Zakirah Weingart robinson rnc,   Also reviewed contraction pattern and that non-stress test is reactive.  It has been documented that patient has had one contraction in the past hour with no cervical change since last OB visit 2 days ago.  Patient denies any other complaints.  Based on this report provider has given order for discharge.  A discharge order and diagnosis entered by a provider.   Labor discharge instructions reviewed with patient.

## 2017-09-28 NOTE — Discharge Instructions (Signed)
Parto vaginal Vaginal Delivery Parto vaginal significa que usted dar a luz empujando al beb fuera del canal del parto (vagina). Un equipo de proveedores de atencin mdica la ayudar antes, durante y despus del parto vaginal. Las experiencias de los nacimientos son nicas para todas las mujeres y Sports administrator y las experiencias de nacimiento varan segn dnde elija dar a luz. Qu debo hacer a fin de prepararme para el nacimiento de mi beb? Antes del nacimiento de su beb, es importante que hable con su mdico sobre lo siguiente:  Sus preferencias en cuanto al Aleen Campi de parto y [place]. Estas pueden incluir lo siguiente: ? Science writer. ? Cmo Human resources officer. Esto podra incluir tcnicas de alivio del dolor no mdicas o medicamentos inyectables para Engineer, materials como la analgesia epidural. ? Cmo se los controlar a usted y a su beb durante el Aleen Campi de parto y [place]. ? Quin puede estar en la sala de [place] de parto y parto con usted. ? Sus opiniones en cuanto al parto quirrgico de su beb (parto por cesrea [place]) si esto fuera necesario. ? Sus opiniones en cuanto a recibir sangre donada a travs de un tubo (catter) intravenoso (transfusin de [place]) si esto fuera necesario.  Si usted puede: ? Tomar fotografas o videos del nacimiento. ? Comer durante el [place] de parto y [place]. ? Moverse, caminar o Multimedia programmer de posicin durante el [place] de parto y [place].  Qu esperar despus del nacimiento de su beb, como: ? Si se ofrece el pinzamiento y corte tardo del cordn umbilical. ? Quin cuidar a su beb inmediatamente despus del nacimiento. ? Medicamentos o pruebas que pueden recomendarse para su beb. ? Si su hospital o centro de parto apoya la lactancia. ? Cunto Furniture conservator/restorer hospital o en el centro de [place].  Cmo cualquier afeccin mdica que usted tenga puede afectar a su beb o la experiencia de Aleen Campi de parto y  [place].  A fin de prepararse para el nacimiento de su beb, usted tambin debe:  Asistir a todas sus visitas de atencin mdica antes del parto (visitas prenatales) segn lo recomendado por su mdico. Esto es importante.  Preparacin del hogar para la llegada de su beb. Asegrese de tener lo siguiente: ? Paales. ? Ropa de beb. ? Equipo de alimentacin. ? Haga preparativos para que usted y su beb puedan dormir de IT consultant.  Instale un asiento de beb en su vehculo. Haga verificar el asiento de beb de su coche por un instalador de asientos de beb para asegurarse de que est instalado en forma segura.  Piense en quin la ayudar con su nuevo beb en el hogar durante al [employer] las primeras semanas despus del [place].  Qu puedo esperar cuando llegue al centro de parto o el hospital? Pollyann Savoy que se inicie el [place] de parto y haya sido admitida en el hospital o centro de parto, el mdico podr hacer lo siguiente:  Revisar sus antecedentes de Psychiatrist y cualquier inquietud que usted pueda tener.  Colocar un tubo (catter) intravenoso en una de sus venas. Esto se [place] para administrarle lquidos y medicamentos.  Verificar su presin arterial, temperatura y pulso y la frecuencia cardaca (signos vitales).  Verificar si la bolsa de agua (saco amnitico) se ha roto (ruptura).  Hablar con usted sobre su plan de nacimiento y Chiropractor las opciones para Human resources officer.  Monitoreo Su mdico puede monitorear las contracciones (monitoreo uterino) y  el ritmo cardaco del beb (monitoreo fetal). Es posible que el monitoreo se necesite realizar:  Con frecuencia, pero no continuamente (intermitentemente).  Todo el tiempo o durante largos perodos a la vez (continuamente). El monitoreo continuo puede ser necesario si: ? Usted est recibiendo Dillard'sdeterminados medicamentos, tales como medicamentos para Engineer, materialsaliviar el dolor o para hacer que las contracciones ms fuertes. ? Tiene complicaciones  durante el embarazo o el Jordantrabajo de [place]parto.  El monitoreo se puede realizar:  Al colocar un estetoscopio especial o un dispositivo manual de monitoreo en el abdomen o verificar los latidos cardacos de su beb y sentir su abdomen para controlar de contracciones. Este mtodo de monitoreo no registra los latidos cardacos de su beb ni sus contracciones de Des Peresmanera continua.  Colocar monitores en el abdomen (monitores externos) para Passenger transport managerregistrar los latidos cardacos de su beb y la frecuencia y duracin de las contracciones. No tendr que tener colocados los monitores externos en todo momento.  Colocar monitores dentro del tero (monitores internos) para Passenger transport managerregistrar los latidos cardacos de su beb y la frecuencia, duracin y fuerza de sus contracciones. ? Su mdico podr usar monitores internos si necesita ms informacin sobre la fuerza de las contracciones o la frecuencia cardaca del beb. ? Los monitores internos se colocan pasando un cable delgado y flexible a travs de la vagina hasta el tero. Segn el tipo de monitor, Insurance claims handlerpuede permanecer en el tero o en la cabeza del beb hasta el nacimiento. ? Su mdico analizar con usted los beneficios y los riesgos de usar un monitor interno y Chief Executive Officerle pedir permiso antes de Psychologist, occupationalcolocar los monitores.  Telemetra. Se trata de un tipo de monitoreo continuo que se puede Education officer, environmentalrealizar con monitores externos o internos. En lugar de Hospital doctortener que permanecer en la cama, usted puede moverse durante Fish farm managerla telemetra. Pregunte a su mdico si la telemetra es una opcin para usted.  Examen fsico. Su mdico puede realizarle un examen fsico. Esto puede incluir lo siguiente:  Verificar en qu posicin se encuentra su beb: ? Con la cabeza hacia la vagina (cabeza abajo). Esta es la posicin ms frecuente. ? Con la cabeza Parker Hannifinhacia la parte superior del tero (cabeza Seychellesarriba o de nalgas). Si su beb est en una posicin de nalgas, su mdico puede tratar de hacerlo girar para que quede cabeza abajo a  fin de poder tener un parto vaginal. Si parece que su beb no puede nacer con parto vaginal, su mdico puede recomendar una ciruga para dar a luz al beb. En casos muy poco frecuentes, usted puede dar a luz con parto vaginal si el beb est cabeza arriba (parto de nalgas). ? Posicin lateral (transversal). Los bebs que estn en posicin lateral no pueden nacer por parto vaginal.  Verificar el cuello uterino para determinar: ? Si se est afinando o estirando (borrando). ? Si se est abriendo (dilatando). ? Cunto se ha movido o ha bajado su beb por el canal del parto.  Cules son las tres etapas del Leesporttrabajo de [place]um[place] Parkparto y Goodyearel parto?  El River Roadtrabajo de parto y el parto normales se dividen en tres etapas: Etapa 1  La etapa 1 es la etapa ms larga del Corwin Springstrabajo de Walnutparto y puede durar horas o Locklanddas. La etapa 1 incluye: ? Trabajo de parto temprano. Esto es cuando las contracciones pueden ser irregulares o regulares y leves. En general, las contracciones del trabajo de parto temprano se producen con ms de 10 minutos de diferencia. ? Tresa Moorerabajo de parto activo. Esto es Kerr-McGeecuando las  contracciones son ms largas, ms regulares, ms frecuentes y ms intensas. ? La fase de transicin. Esto es cuando las contracciones ocurren muy seguidas, son Lynnae Sandhoffmuy intensas y pueden durar ms que durante cualquier otra parte del Brighttrabajo de Evermanparto.  En general, las contracciones son leves, infrecuentes e irregulares al principio. Se hacen ms fuertes, ms frecuentes (aproximadamente cada 2 o 3 minutos) y ms regulares a medida que usted avanza desde un trabajo de parto temprano [place] un [facility] [employer] [facility] Johnstowntrabajo de Iranparto activo y fase de transicin.  Muchas mujeres progresan a travs de la etapa 1 de forma natural, pero es posible que usted necesite ayuda para continuar avanzando. Si esto ocurre, su mdico puede hablar con usted sobre lo siguiente: ? La ruptura del saco amnitico si es que an no ha ocurrido. ? Administracin de medicamentos para ayudarla  a tener contracciones ms fuertes y ms frecuentes.  La etapa 1 finaliza cuando el cuello uterino est completamente dilatado hasta 4 pulgadas (10cm) y completamente borrado. Esto ocurre al final de la fase de transicin. Etapa 2  Una vez que el cuello uterino est totalmente borrado y dilatado a 4 pulgadas (10cm), usted puede comenzar a sentir ganas de pujar. Es comn que el cuerpo tome un descanso de Brunswickmanera natural antes de sentir ganas de pujar, especialmente si recibe una epidural u otros medicamentos para Chief Technology Officerel dolor. Este perodo de descanso puede durar un mximo de 1 a 2 horas, segn su experiencia de parto nica.  Durante la etapa 2, las contracciones son generalmente menos doloras porque pujar ayuda a Engineer, materialsaliviar el dolor de las contracciones. En lugar del dolor de las contracciones, puede sentir un dolor urente y por estiramiento, especialmente cuando la parte ms ancha de la cabeza de su beb pasa a travs de la abertura vaginal (coronacin).  Su mdico controlar atentamente su avance con los pujos y el avance del beb a travs de la vagina durante la etapa 2.  Su mdico puede Advice workermasajear el rea de la piel entre la abertura vaginal y el ano (perineo) o aplicar compresas tibias en el perineo. Esto ayuda al estiramiento ya que la cabeza del beb empieza a Research officer, trade unionaparecer, lo cual puede ayudar a evitar un desgarro perineal. ? En algunos casos, se puede hacer una incisin en el peritoneo (episiotoma) para permitir que el beb pase a travs de la abertura vaginal. La episiotoma sirve para agrandar la abertura vaginal a fin de que el beb tenga ms espacio para pasar durante el parto.  Es muy importante respirar y concentrarse para que el mdico pueda controlar la salida de la cabeza del beb. Es posible que su mdico tenga que disminuir la intensidad de los pujos para ayudar a Astronomerevitar un desgarro perineal.  Despus de que sale la cabeza del beb, en general salen los hombros y el resto del cuerpo muy  rpidamente y sin dificultad.  Una vez que el beb nace, se debe cortar el cordn umbilical de inmediato o esto puede demorar 1 o 2 minutos, segn la salud del beb. Este procedimiento puede variar segn el mdico, el hospital y [place] centro de Culebraparto.  Si usted y su beb estn lo suficientemente sanos, se Journalist, newspaperle colocar el beb en el pecho o el abdomen para ayudar a Tour managermantener la temperatura del beb y el vnculo entre ustedes. Algunas madres y bebs comienzan la lactancia en este momento. Su equipo mdico secar al beb y lo ayudar a Research scientist (life sciences)mantenerse caliente Amgen Incdurante este tiempo.  Es posible que su beb necesite atencin  inmediata si: ? Mostr signos de sufrimiento durante el trabajo de [place]. ? Tiene una afeccin mdica. ? Naci antes de tiempo (prematuro). ? Defeca antes del nacimiento (meconio). ? Muestra signos de dificultar en la transicin de estar dentro del tero a estar fuera del tero. Si tiene Industrial/product designer, su equipo mdico la ayudar a Lawyer. Etapa 3  La tercera etapa del trabajo de parto comienza inmediatamente despus del nacimiento de su beb y finaliza despus de la expulsin de la placenta. La placenta es un rgano de que desarrolla durante el embarazo para proporcionar oxgeno y nutrientes al beb en el tero.  La expulsin de la placenta puede requerir algunos pujos y es posible que usted tenga contracciones leves. La lactancia puede estimular las contracciones para ayudar a expulsar la placenta.  Luego de la expulsin de la placenta, el tero debe (contraerse) y [place]. Esto ayuda a Civil Service fast streamer. Para ayudar al tero a contraerse y controlar el [place], [place] mdico puede: ? Administrarle un medicamento inyectable, a travs de un tubo (catter) intravenoso, por boca o a travs del recto (por va rectal). ? Masajear el abdomen o realizar un examen de la vagina para extraer cualquier cogulo de sangre que quede en el tero. ? Vaciar la  vejiga colocando un tubo flexible (catter) en la vejiga. ? Alentarla a amamantar a su beb. Una vez que termina el [place], se los controlar a usted y a su beb atentamente para Warehouse manager la seguridad de que ambos estn sanos hasta que estn listos para ir a casa. Su equipo de atencin Art gallery manager cmo cuidarse y cuidar a su beb. Esta informacin no tiene Theme park manager el consejo del mdico. Asegrese de hacerle al mdico cualquier pregunta que tenga. Document Released: 03/22/2008 Document Revised: 07/19/2016 Document Reviewed: 04/24/2015 Elsevier Interactive Patient Education  2018 [employer].   [employer] of the uterus can occur throughout pregnancy, but they are not always a sign that you are in labor. You may have practice contractions called Braxton Hicks contractions. These false labor contractions are sometimes confused with true labor. What are Deberah Pelton contractions? Braxton Hicks contractions are tightening movements that occur in the muscles of the uterus before labor. Unlike true labor contractions, these contractions do not result in opening (dilation) and thinning of the cervix. Toward the end of pregnancy (32-34 weeks), Braxton Hicks contractions can happen more often and may become stronger. These contractions are sometimes difficult to tell apart from true labor because they can be very uncomfortable. You should not feel embarrassed if you go to the hospital with false labor. Sometimes, the only way to tell if you are in true labor is for your health care provider to look for changes in the cervix. The health care provider will do a physical exam and may monitor your contractions. If you are not in true labor, the exam should show that your cervix is not dilating and your water has not broken. If there are other health problems associated with your pregnancy, it is completely safe for you to be sent home with false labor. You may  continue to have Braxton Hicks contractions until you go into true labor. How to tell the difference between true labor and false labor True labor  Contractions last 30-70 seconds.  Contractions become very regular.  Discomfort is usually felt in the top of the uterus, and it spreads to the lower abdomen and low  back.  Contractions do not go away with walking.  Contractions usually become more intense and increase in frequency.  The cervix dilates and gets thinner. False labor  Contractions are usually shorter and not as strong as true labor contractions.  Contractions are usually irregular.  Contractions are often felt in the front of the lower abdomen and in the groin.  Contractions may go away when you walk around or change positions while lying down.  Contractions get weaker and are shorter-lasting as time goes on.  The cervix usually does not dilate or become thin. Follow these instructions at home:  Take over-the-counter and prescription medicines only as told by your health care provider.  Keep up with your usual exercises and follow other instructions from your health care provider.  Eat and drink lightly if you think you are going into labor.  If Braxton Hicks contractions are making you uncomfortable: ? Change your position from lying down or resting to walking, or change from walking to resting. ? Sit and rest in a tub of warm water. ? Drink enough fluid to keep your urine pale yellow. Dehydration may cause these contractions. ? Do slow and deep breathing several times an hour.  Keep all follow-up prenatal visits as told by your health care provider. This is important. Contact a health care provider if:  You have a fever.  You have continuous pain in your abdomen. Get help right away if:  Your contractions become stronger, more regular, and closer together.  You have fluid leaking or gushing from your vagina.  You pass blood-tinged mucus (bloody  show).  You have bleeding from your vagina.  You have low back pain that you never had before.  You feel your babys head pushing down and causing pelvic pressure.  Your baby is not moving inside you as much as it used to. Summary  Contractions that occur before labor are called Braxton Hicks contractions, false labor, or practice contractions.  Braxton Hicks contractions are usually shorter, weaker, farther apart, and less regular than true labor contractions. True labor contractions usually become progressively stronger and regular and they become more frequent.  Manage discomfort from [facility] Community HospitalBraxton Hicks contractions by changing position, resting in a warm bath, drinking plenty of water, or practicing deep breathing. This information is not intended to replace advice given to you by your health care provider. Make sure you discuss any questions you have with your health care provider. Document Released: 08/23/2016 Document Revised: 08/23/2016 Document Reviewed: 08/23/2016 Elsevier Interactive Patient Education  2018 [employer]

## 2017-09-28 NOTE — MAU Note (Addendum)
Patient c/o   +vaginal bleeding Started this am Notices only when she wipes Pink in color Intercourse last night  Denies LOF  +FM  Also reports also feeling warm and sweating last night. A little shaky she reports.

## 2017-09-29 ENCOUNTER — Encounter (HOSPITAL_COMMUNITY): Payer: Self-pay

## 2017-09-29 ENCOUNTER — Inpatient Hospital Stay (HOSPITAL_COMMUNITY)
Admission: AD | Admit: 2017-09-29 | Discharge: 2017-10-02 | DRG: 807 | Disposition: A | Discharge status: Home / Self Care | Payer: Medicaid Other | Attending: Obstetrics and Gynecology | Admitting: Obstetrics and Gynecology

## 2017-09-29 DIAGNOSIS — Z3A39 39 weeks gestation of pregnancy: Secondary | ICD-10-CM

## 2017-09-29 DIAGNOSIS — Z3400 Encounter for supervision of normal first pregnancy, unspecified trimester: Secondary | ICD-10-CM

## 2017-09-29 DIAGNOSIS — Z91040 Latex allergy status: Secondary | ICD-10-CM | POA: Diagnosis not present

## 2017-09-29 DIAGNOSIS — O4292 Full-term premature rupture of membranes, unspecified as to length of time between rupture and onset of labor: Secondary | ICD-10-CM | POA: Diagnosis present

## 2017-09-29 DIAGNOSIS — O4290 Premature rupture of membranes, unspecified as to length of time between rupture and onset of labor, unspecified weeks of gestation: Secondary | ICD-10-CM | POA: Diagnosis present

## 2017-09-29 LAB — CBC
HCT: 37.3 % (ref 36.0–46.0)
Hemoglobin: 12.5 g/dL (ref 12.0–15.0)
MCH: 30.3 pg (ref 26.0–34.0)
MCHC: 33.5 g/dL (ref 30.0–36.0)
MCV: 90.3 fL (ref 78.0–100.0)
PLATELETS: 106 10*3/uL — AB (ref 150–400)
RBC: 4.13 MIL/uL (ref 3.87–5.11)
RDW: 14.1 % (ref 11.5–15.5)
WBC: 9.2 10*3/uL (ref 4.0–10.5)

## 2017-09-29 LAB — TYPE AND SCREEN
ABO/RH(D): O POS
Antibody Screen: NEGATIVE

## 2017-09-29 LAB — ABO/RH: ABO/RH(D): O POS

## 2017-09-29 LAB — POCT FERN TEST: POCT Fern Test: POSITIVE

## 2017-09-29 MED ORDER — LIDOCAINE HCL (PF) 1 % IJ SOLN
30.0000 mL | INTRAMUSCULAR | Status: DC | PRN
  Administered 2017-09-30: 30 mL via SUBCUTANEOUS
  Filled 2017-09-29: qty 30

## 2017-09-29 MED ORDER — MISOPROSTOL 50MCG HALF TABLET: 50.0000 ug | ORAL_TABLET | ORAL | Status: DC | PRN

## 2017-09-29 MED ORDER — ONDANSETRON HCL 4 MG/2ML IJ SOLN: 4.0000 mg | Freq: Four times a day (QID) | INTRAMUSCULAR | Status: DC | PRN

## 2017-09-29 MED ORDER — SOD CITRATE-CITRIC ACID 500-334 MG/5ML PO SOLN: 30.0000 mL | ORAL | Status: DC | PRN

## 2017-09-29 MED ORDER — MISOPROSTOL 25 MCG QUARTER TABLET
25.0000 ug | ORAL_TABLET | Freq: Once | ORAL | Status: AC
  Administered 2017-09-29: 25 ug via ORAL

## 2017-09-29 MED ORDER — LACTATED RINGERS IV SOLN: INTRAVENOUS | Status: DC

## 2017-09-29 MED ORDER — MISOPROSTOL 50MCG HALF TABLET
50.0000 ug | ORAL_TABLET | ORAL | Status: DC | PRN
  Filled 2017-09-29: qty 1

## 2017-09-29 MED ORDER — ACETAMINOPHEN 325 MG PO TABS
650.0000 mg | ORAL_TABLET | ORAL | Status: DC | PRN
  Filled 2017-09-29: qty 2

## 2017-09-29 MED ORDER — OXYTOCIN BOLUS FROM INFUSION: 500.0000 mL | Freq: Once | INTRAVENOUS | Status: DC

## 2017-09-29 MED ORDER — OXYTOCIN 40 UNITS IN LACTATED RINGERS INFUSION - SIMPLE MED
2.5000 [IU]/h | INTRAVENOUS | Status: DC
  Filled 2017-09-29: qty 1000

## 2017-09-29 MED ORDER — TERBUTALINE SULFATE 1 MG/ML IJ SOLN
0.2500 mg | Freq: Once | INTRAMUSCULAR | Status: DC | PRN
  Filled 2017-09-29: qty 1

## 2017-09-29 MED ORDER — FENTANYL CITRATE (PF) 100 MCG/2ML IJ SOLN
50.0000 ug | INTRAMUSCULAR | Status: DC | PRN
  Administered 2017-09-29 – 2017-09-30 (×5): 100 ug via INTRAVENOUS
  Filled 2017-09-29 (×5): qty 2

## 2017-09-29 MED ORDER — OXYCODONE-ACETAMINOPHEN 5-325 MG PO TABS
2.0000 | ORAL_TABLET | ORAL | Status: DC | PRN
  Administered 2017-09-30: 2 via ORAL
  Filled 2017-09-29: qty 2

## 2017-09-29 MED ORDER — OXYCODONE-ACETAMINOPHEN 5-325 MG PO TABS: 1.0000 | ORAL_TABLET | ORAL | Status: DC | PRN

## 2017-09-29 MED ORDER — MISOPROSTOL 25 MCG QUARTER TABLET
25.0000 ug | ORAL_TABLET | ORAL | Status: DC | PRN
  Administered 2017-09-29: 25 ug via ORAL
  Filled 2017-09-29 (×2): qty 1

## 2017-09-29 MED ORDER — LACTATED RINGERS IV SOLN
INTRAVENOUS | Status: DC
  Administered 2017-09-29 (×2): via INTRAVENOUS

## 2017-09-29 MED ORDER — FLEET ENEMA 7-19 GM/118ML RE ENEM: 1.0000 | ENEMA | RECTAL | Status: DC | PRN

## 2017-09-29 MED ORDER — LACTATED RINGERS IV SOLN: 500.0000 mL | INTRAVENOUS | Status: DC | PRN

## 2017-09-29 NOTE — MAU Note (Signed)
Maybe some leaking last night, big gush today, yellow tinged/clear. Some spotting, no pain, +FM

## 2017-09-29 NOTE — MAU Provider Note (Signed)
First Provider Initiated Contact with Patient 09/29/17 1631       S: Ms. [name] is a 32 y.o. G1P0 at 2436w4d  who presents to MAU today complaining of leaking of fluid since 2000 on 09/28/17. She denies vaginal bleeding. She denies contractions. She reports normal fetal movement.    O: BP 117/81 (BP Location: Left Arm)   Pulse 92   Temp 98.6 F (37 C) (Oral)   Resp 18   Ht 5' 5.35" (1.66 m)   Wt 154 lb (69.9 kg)   LMP 12/26/2016   BMI 25.35 kg/m  GENERAL: Well-developed, well-nourished female in no acute distress.  HEAD: Normocephalic, atraumatic.  CHEST: Normal effort of breathing, regular heart rate ABDOMEN: Soft, nontender, gravid PELVIC: Normal external female genitalia. Grossly ruptured with fluid on the perineum.   Cervical exam: deferred as patient with ROM >12 hours, and no contractions Presentation: Vertex(by bedside u/s)   Fetal Monitoring: Baseline: 135 Variability: moderate Accelerations: 15x15 Decelerations: none Contractions: none  Results for orders placed or performed during the hospital encounter of 09/29/17 (from the past 24 hour(s))  Fern Test     Status: Abnormal   Collection Time: 09/29/17  4:32 PM  Result Value Ref Range   POCT Fern Test Positive = ruptured amniotic membanes      A: [facility] at 5636w4d  SROM  P: RN to call the labor team for admission   Eugenio HoesHogan, Heather D, CNM 09/29/2017 4:33 PM

## 2017-09-29 NOTE — H&P (Signed)
Lisa StallsClaudia Penafiel Hodge is a 32 y.o. female G1P0 Estimated Date of Delivery: 10/02/17 presenting for SROM at 2200 09/28/2017. No contractions OB History    Gravida  1   Para      Term      Preterm      AB      Living  0     SAB      TAB      Ectopic      Multiple      Live Births             Past Medical History:  Diagnosis Date  . Medical history non-contributory    Past Surgical History:  Procedure Laterality Date  . NO PAST SURGERIES     Family History: family history includes Diabetes in her father; Heart disease in her mother. Social History:  reports that she has never smoked. She has never used smokeless tobacco. She reports that she drinks alcohol. She reports that she does not use drugs.     Maternal Diabetes: No Genetic Screening: Normal Maternal Ultrasounds/Referrals: Abnormal:  Findings:   Other:fetal lacrimal duct cyst Fetal Ultrasounds or other Referrals:   Maternal Substance Abuse:  No Significant Maternal Medications:  None Significant Maternal Lab Results:  None Other Comments:    ROS   Review of Systems  Constitutional: Negative for fever, chills, weight loss, malaise/fatigue and diaphoresis.  HENT: Negative for hearing loss, ear pain, nosebleeds, congestion, sore throat, neck pain, tinnitus and ear discharge.   Eyes: Negative for blurred vision, double vision, photophobia, pain, discharge and redness.  Respiratory: Negative for cough, hemoptysis, sputum production, shortness of breath, wheezing and stridor.   Cardiovascular: Negative for chest pain, palpitations, orthopnea, claudication, leg swelling and PND.  Gastrointestinal: negative for abdominal pain. Negative for heartburn, nausea, vomiting, diarrhea, constipation, blood in stool and melena.  Genitourinary: Negative for dysuria, urgency, frequency, hematuria and flank pain.  Musculoskeletal: Negative for myalgias, back pain, joint pain and falls.  Skin: Negative for itching and rash.   Neurological: Negative for dizziness, tingling, tremors, sensory change, speech change, focal weakness, seizures, loss of consciousness, weakness and headaches.  Endo/Heme/Allergies: Negative for environmental allergies and polydipsia. Does not bruise/bleed easily.  Psychiatric/Behavioral: Negative for depression, suicidal ideas, hallucinations, memory loss and substance abuse. The patient is not nervous/anxious and does not have insomnia.      History   Blood pressure 118/79, pulse 84, temperature 98.4 F (36.9 C), temperature source Oral, resp. rate 17, height 5\' 5"  (1.651 m), weight 154 lb (69.9 kg), last menstrual period 12/26/2016. Exam Physical Exam  O: BP 117/81 (BP Location: Left Arm)   Pulse 92   Temp 98.6 F (37 C) (Oral)   Resp 18   Ht 5' 5.35" (1.66 m)   Wt 154 lb (69.9 kg)   LMP 12/26/2016   BMI 25.35 kg/m  GENERAL: Well-developed, well-nourished female in no acute distress.  HEAD: Normocephalic, atraumatic.  CHEST: Normal effort of breathing, regular heart rate ABDOMEN: Soft, nontender, gravid PELVIC: Normal external female genitalia. Grossly ruptured with fluid on the perineum.   Cervical exam: deferred as patient with ROM >12 hours, and no contractions Presentation: Vertex(by bedside u/s)    Prenatal labs: ABO, Rh: --/--/O POS (06/09 1746) Antibody: NEG (06/09 1746) Rubella: 11.50 (01/15 1446) RPR: Non Reactive (03/14 0843)  HBsAg: Negative (01/15 1446)  HIV: Non Reactive (03/14 0843)  GBS: Negative (05/27 0000)   Assessment/Plan: 811w4d Estimated Date of Delivery: 10/02/17 SROM 2200 09/28/2017  No labor  Proceed with cervical ripening and IOL   Lazaro Arms 09/29/2017, 7:23 PM

## 2017-09-29 NOTE — Progress Notes (Signed)
*  Assessment, exam and explanation of procedure done with assistance from phone medical Spanish Interpreter [phone]#[id number]*  Subjective: Lisa Hodge is a 32 y.o. G1P0 at 622w4d by LMP admitted for rupture of membranes @ 2200 on 09/28/2017.  Objective: BP 134/84   Pulse 73   Temp 98.2 F (36.8 C) (Oral)   Resp 18   Ht 5\' 5"  (1.651 m)   Wt 154 lb (69.9 kg)   LMP 12/26/2016   BMI 25.63 kg/m  No intake/output data recorded. No intake/output data recorded.  FHT:  FHR: 135 bpm, variability: moderate,  accelerations:  Present,  decelerations:  Absent UC:   regular, every 4-5 minutes SVE:   Dilation: 2 Effacement (%): 70 Station: 0 Exam by: Carloyn Jaeger. Melania Kirks, CNM  Cervical Balloon placed without difficulty, pt tolerated well  Labs: Lab Results  Component Value Date   WBC 9.2 09/29/2017   HGB 12.5 09/29/2017   HCT 37.3 09/29/2017   MCV 90.3 09/29/2017   PLT 106 (L) 09/29/2017    Assessment / Plan: Augmentation of labor, progressing well  Labor: Progressing normally, will start Pitocin after cervical balloon is out Preeclampsia:  n/s Fetal Wellbeing:  Category I Pain Control:  IV pain meds - requested I/D:  n/a Anticipated MOD:  NSVD  Raelyn Moraolitta Emylee Decelle, MSN, CNM 09/29/2017, 11:42 PM

## 2017-09-30 ENCOUNTER — Encounter (HOSPITAL_COMMUNITY): Payer: Self-pay | Admitting: *Deleted

## 2017-09-30 DIAGNOSIS — Z3A39 39 weeks gestation of pregnancy: Secondary | ICD-10-CM

## 2017-09-30 LAB — CBC
HEMATOCRIT: 35.3 % — AB (ref 36.0–46.0)
HEMOGLOBIN: 12 g/dL (ref 12.0–15.0)
MCH: 30.6 pg (ref 26.0–34.0)
MCHC: 34 g/dL (ref 30.0–36.0)
MCV: 90.1 fL (ref 78.0–100.0)
Platelets: 107 10*3/uL — ABNORMAL LOW (ref 150–400)
RBC: 3.92 MIL/uL (ref 3.87–5.11)
RDW: 13.9 % (ref 11.5–15.5)
WBC: 15.3 10*3/uL — ABNORMAL HIGH (ref 4.0–10.5)

## 2017-09-30 LAB — RPR: RPR Ser Ql: NONREACTIVE

## 2017-09-30 MED ORDER — TERBUTALINE SULFATE 1 MG/ML IJ SOLN
0.2500 mg | Freq: Once | INTRAMUSCULAR | Status: DC | PRN
  Filled 2017-09-30: qty 1

## 2017-09-30 MED ORDER — OXYTOCIN 10 UNIT/ML IJ SOLN
INTRAMUSCULAR | Status: AC
  Administered 2017-09-30: 10 [IU]
  Filled 2017-09-30: qty 1

## 2017-09-30 MED ORDER — DIPHENHYDRAMINE HCL 50 MG/ML IJ SOLN: 12.5000 mg | INTRAMUSCULAR | Status: DC | PRN

## 2017-09-30 MED ORDER — PHENYLEPHRINE 40 MCG/ML (10ML) SYRINGE FOR IV PUSH (FOR BLOOD PRESSURE SUPPORT)
80.0000 ug | PREFILLED_SYRINGE | INTRAVENOUS | Status: DC | PRN
Start: 2017-09-30 — End: 2017-09-30
  Filled 2017-09-30: qty 5

## 2017-09-30 MED ORDER — OXYTOCIN 10 UNIT/ML IJ SOLN: 10.0000 [IU] | Freq: Once | INTRAMUSCULAR | Status: AC

## 2017-09-30 MED ORDER — COCONUT OIL OIL: 1.0000 "application " | TOPICAL_OIL | Status: DC | PRN

## 2017-09-30 MED ORDER — SENNOSIDES-DOCUSATE SODIUM 8.6-50 MG PO TABS
2.0000 | ORAL_TABLET | ORAL | Status: DC
  Administered 2017-10-02: 2 via ORAL
  Filled 2017-09-30: qty 2

## 2017-09-30 MED ORDER — OXYCODONE HCL 5 MG PO TABS: 5.0000 mg | ORAL_TABLET | ORAL | Status: DC | PRN

## 2017-09-30 MED ORDER — OXYTOCIN 40 UNITS IN LACTATED RINGERS INFUSION - SIMPLE MED: 1.0000 m[IU]/min | INTRAVENOUS | Status: DC

## 2017-09-30 MED ORDER — PRENATAL MULTIVITAMIN CH
1.0000 | ORAL_TABLET | Freq: Every day | ORAL | Status: DC
  Administered 2017-10-01: 1 via ORAL
  Filled 2017-09-30 (×2): qty 1

## 2017-09-30 MED ORDER — DIBUCAINE 1 % RE OINT: 1.0000 "application " | TOPICAL_OINTMENT | RECTAL | Status: DC | PRN

## 2017-09-30 MED ORDER — EPHEDRINE 5 MG/ML INJ
10.0000 mg | INTRAVENOUS | Status: DC | PRN
  Filled 2017-09-30: qty 2

## 2017-09-30 MED ORDER — FENTANYL 2.5 MCG/ML BUPIVACAINE 1/10 % EPIDURAL INFUSION (WH - ANES): 14.0000 mL/h | INTRAMUSCULAR | Status: DC | PRN

## 2017-09-30 MED ORDER — SIMETHICONE 80 MG PO CHEW: 80.0000 mg | CHEWABLE_TABLET | ORAL | Status: DC | PRN

## 2017-09-30 MED ORDER — PHENYLEPHRINE 40 MCG/ML (10ML) SYRINGE FOR IV PUSH (FOR BLOOD PRESSURE SUPPORT)
80.0000 ug | PREFILLED_SYRINGE | INTRAVENOUS | Status: DC | PRN
  Filled 2017-09-30: qty 5

## 2017-09-30 MED ORDER — LACTATED RINGERS IV SOLN
500.0000 mL | Freq: Once | INTRAVENOUS | Status: AC
  Administered 2017-09-30: 500 mL via INTRAVENOUS

## 2017-09-30 MED ORDER — OXYCODONE HCL 5 MG PO TABS: 10.0000 mg | ORAL_TABLET | ORAL | Status: DC | PRN

## 2017-09-30 MED ORDER — WITCH HAZEL-GLYCERIN EX PADS
1.0000 "application " | MEDICATED_PAD | CUTANEOUS | Status: DC | PRN
  Administered 2017-09-30: 1 via TOPICAL

## 2017-09-30 MED ORDER — IBUPROFEN 600 MG PO TABS
600.0000 mg | ORAL_TABLET | Freq: Four times a day (QID) | ORAL | Status: DC
  Administered 2017-09-30 – 2017-10-02 (×6): 600 mg via ORAL
  Filled 2017-09-30 (×7): qty 1

## 2017-09-30 MED ORDER — ZOLPIDEM TARTRATE 5 MG PO TABS: 5.0000 mg | ORAL_TABLET | Freq: Every evening | ORAL | Status: DC | PRN

## 2017-09-30 MED ORDER — ACETAMINOPHEN 325 MG PO TABS: 650.0000 mg | ORAL_TABLET | ORAL | Status: DC | PRN

## 2017-09-30 MED ORDER — BENZOCAINE-MENTHOL 20-0.5 % EX AERO
1.0000 "application " | INHALATION_SPRAY | CUTANEOUS | Status: DC | PRN
  Administered 2017-09-30: 1 via TOPICAL
  Filled 2017-09-30: qty 56

## 2017-09-30 MED ORDER — TETANUS-DIPHTH-ACELL PERTUSSIS 5-2.5-18.5 LF-MCG/0.5 IM SUSP: 0.5000 mL | Freq: Once | INTRAMUSCULAR | Status: DC

## 2017-09-30 MED ORDER — ONDANSETRON HCL 4 MG PO TABS: 4.0000 mg | ORAL_TABLET | ORAL | Status: DC | PRN

## 2017-09-30 MED ORDER — ONDANSETRON HCL 4 MG/2ML IJ SOLN: 4.0000 mg | INTRAMUSCULAR | Status: DC | PRN

## 2017-09-30 MED ORDER — DIPHENHYDRAMINE HCL 25 MG PO CAPS: 25.0000 mg | ORAL_CAPSULE | Freq: Four times a day (QID) | ORAL | Status: DC | PRN

## 2017-09-30 NOTE — Progress Notes (Signed)
Patient ID: Lisa Hodge, female   DOB: Oct 31, 1985, 32 y.o.   MRN: [id number] Lisa Hodge is a 32 y.o. G1P0 at 147w5d.  Subjective: Pt progressed rapidly to C/C/+2. Not coping well initially. Has involuntary urge to push, but is fighting it.   [facility] pushing.   Objective: BP (!) 148/74   Pulse (!) 139   Temp 98.2 F (36.8 C) (Oral)   Resp 18   Ht 5\' 5"  (1.651 m)   Wt 154 lb (69.9 kg)   LMP 12/26/2016   BMI 25.63 kg/m    FHT:  FHR: 135 bpm, variability: mod,  accelerations:  15x15,  decelerations:  Variables, but also intermittently tracing MHR. RN adjusting EFM UC:   Q 2-4 minutes, mod-strong Dilation: 10 Dilation Complete Date: 09/30/17 Dilation Complete Time: 0821 Effacement (%): 100 Cervical Position: Middle Station: Plus 2, Plus 3(caput) Presentation: Vertex Exam by:: Enis SlipperJane Bailey, RN  Labs: Results for orders placed or performed during the hospital encounter of 09/29/17 (from the past 24 hour(s))  Fern Test     Status: Abnormal   Collection Time: 09/29/17  4:32 PM  Result Value Ref Range   POCT Fern Test Positive = ruptured amniotic membanes   RPR     Status: None   Collection Time: 09/29/17  4:50 PM  Result Value Ref Range   RPR Ser Ql Non Reactive Non Reactive  CBC     Status: Abnormal   Collection Time: 09/29/17  5:00 PM  Result Value Ref Range   WBC 9.2 4.0 - 10.5 K/uL   RBC 4.13 3.87 - 5.11 MIL/uL   Hemoglobin 12.5 12.0 - 15.0 g/dL   HCT 72.537.3 36.636.0 - 44.046.0 %   MCV 90.3 78.0 - 100.0 fL   MCH 30.3 26.0 - 34.0 pg   MCHC 33.5 30.0 - 36.0 g/dL   RDW 34.714.1 42.511.5 - 95.615.5 %   Platelets 106 (L) 150 - 400 K/uL  Type and screen     Status: None   Collection Time: 09/29/17  5:46 PM  Result Value Ref Range   ABO/RH(D) O POS    Antibody Screen NEG    Sample Expiration      10/02/2017 Performed at [facility], [address] [place], [place] [address]   ABO/Rh     Status: None   Collection Time: 09/29/17   5:50 PM  Result Value Ref Range   ABO/RH(D)      O POS Performed at [facility], [address] [place], [place] [address]   CBC     Status: Abnormal   Collection Time: 09/30/17  6:59 AM  Result Value Ref Range   WBC 15.3 (H) 4.0 - 10.5 K/uL   RBC 3.92 3.87 - 5.11 MIL/uL   Hemoglobin 12.0 12.0 - 15.0 g/dL   HCT 51.835.3 (L) 84.136.0 - 66.046.0 %   MCV 90.1 78.0 - 100.0 fL   MCH 30.6 26.0 - 34.0 pg   MCHC 34.0 30.0 - 36.0 g/dL   RDW 63.013.9 16.011.5 - 10.915.5 %   Platelets 107 (L) 150 - 400 K/uL    Assessment / Plan: 367w5d week IUP Labor: Second stage Fetal Wellbeing:  Category II, but overall reassuring Pain Control:  Comfort measures Anticipated MOD:  SVD  Katrinka BlazingSmith, [place], CNM 09/30/2017 9:45 AM

## 2017-09-30 NOTE — Anesthesia Pain Management Evaluation Note (Signed)
  CRNA Pain Management Visit Note  Patient: Lisa Hodge, 32 y.o., female  "Hello I am a member of the anesthesia team at [facility]. We have an anesthesia team available at all times to provide care throughout the hospital, including epidural management and anesthesia for C-section. I don't know your plan for the delivery whether it a natural birth, water birth, IV sedation, nitrous supplementation, doula or epidural, but we want to meet your pain goals."   1.Was your pain managed to your expectations on prior hospitalizations?   Yes   2.What is your expectation for pain management during this hospitalization?     Epidural  3.How can we help you reach that goal? epidural  Record the patient's initial score and the patient's pain goal.   Pain: 9/10  Pain Goal: 1/10 [facility] wants you to be able to say your pain was always managed very well.  Salome ArntSterling, Bernhardt Riemenschneider Marie 09/30/2017

## 2017-09-30 NOTE — Lactation Note (Signed)
This note was copied from a baby's chart. Lactation Consultation Note  Patient Name: Lisa Nolon StallsClaudia Penafiel Cedeno ZOXWR'UToday's Date: 09/30/2017   Initial LC Visit:  RN coming out of room as I was going to enter.  She suggested I come back at a later time.  She will call me when I can come back and help with latch assistance.       Donshay Lupinski R Charlet Harr 09/30/2017, 11:00 PM

## 2017-10-01 NOTE — Progress Notes (Addendum)
Rn used interpreter [phone] to explain why baby is spitting. Rn suggested that baby could be watched in Cn and bath given. When mom pressed call bell earlier, mom was unable to handle baby's spitting episode. Mom is very tired and Rn is afraid mom may fall asleep.

## 2017-10-01 NOTE — Lactation Note (Signed)
This note was copied from a baby's chart. PLactation Consultation Note  Patient Name: Lisa Hodge ZOXWR'UToday's Date: 10/01/2017 Reason for consult: Follow-up assessment;Term;Difficult latch  P1 mother whose infant is now 618 hours old.  Mother still asking for latch assistance.  When I entered father was holding baby swaddled in blanket.  I offered to assist with latch and mother accepted.  I suggested the infant be STS but mother had a tshirt on baby.  Baby continues to be spitty and requires frequent burping.  Attempted to latch in the football hold on the left breast.  After a couple of attempts he was able to latch but would only suck 3-4 times before releasing.  Repeated attempts to latch failed and he was put STS on mother's chest.  Reinforced all teaching that was covered in earlier visit.  Mother needs to be reminded to be more assertive with the feedings and to be sure he gets a deep latch.  Father of baby supportive.  Mother will call for assistance as needed.    Maternal Data Formula Feeding for Exclusion: No Has patient been taught Hand Expression?: Yes Does the patient have breastfeeding experience prior to this delivery?: No  Feeding Feeding Type: Breast Fed Length of feed: 0 min(attempted)  LATCH Score Latch: Repeated attempts needed to sustain latch, nipple held in mouth throughout feeding, stimulation needed to elicit sucking reflex.  Audible Swallowing: None  Type of Nipple: Everted at rest and after stimulation  Comfort (Breast/Nipple): Soft / non-tender  Hold (Positioning): Assistance needed to correctly position infant at breast and maintain latch.  LATCH Score: 6  Interventions Interventions: Breast feeding basics reviewed;Assisted with latch;Skin to skin;Breast massage;Hand express;Position options;Support pillows;Adjust position;Breast compression  Lactation Tools Discussed/Used WIC Program: No   Consult Status Consult Status:  Follow-up Date: 10/02/17 Follow-up type: In-patient    Jazlynn Nemetz R Naseer Hearn 10/01/2017, 7:23 AM

## 2017-10-01 NOTE — Progress Notes (Signed)
Interpreter [name] Royale translates plan of care maternal and infant safety. Patient states understanding information.

## 2017-10-01 NOTE — Progress Notes (Signed)
Post Partum Day 1 Subjective: no complaints, up ad lib, voiding and tolerating PO  Objective: Blood pressure 95/60, pulse 70, temperature 98.2 F (36.8 C), temperature source Oral, resp. rate 18, height 5\' 5"  (1.651 m), weight 154 lb (69.9 kg), last menstrual period 12/26/2016, unknown if currently breastfeeding.  Physical Exam:  General: alert, cooperative and no distress Lochia: appropriate Uterine Fundus: firm DVT Evaluation: No evidence of DVT seen on physical exam. No cords or calf tenderness. No significant calf/ankle edema.  Recent Labs    09/29/17 1700 09/30/17 0659  HGB 12.5 12.0  HCT 37.3 35.3*    Assessment/Plan: Plan for discharge tomorrow   LOS: 2 days   Caryl AdaJazma Toria Monte, DO 10/01/2017, 11:20 AM

## 2017-10-01 NOTE — Lactation Note (Signed)
This note was copied from a baby's chart. Lactation Consultation Note  Patient Name: Lisa Nolon StallsClaudia Penafiel Cedeno ZOXWR'UToday's Date: 10/01/2017 Reason for consult: Initial assessment;Primapara;1st time breastfeeding;Term;Difficult latch  P1 mother whose infant is now 6512 hours old.  Interpreter [phone] used for translation.  Per RN,  infant has been very spitty and will be taken to the nursery after I visit with mother.  Mother is concerned that baby has not fed yet.  She stated, "He doesn't like my milk."  I reassured her that this is not true and explained about amniotic fluid and spitty babies.  Mother knows how to use the bulb syringe.    Even though baby was not showing feeding cues I offered to attempt to latch him with her permission.  Infant sucked on my gloved finger without difficulty.  Attempted to latch in the football hold on the left breast.  He would not open his mouth at all and showed no interest in feeding.  I showed mother techniques to get him to open and ways to stimulate baby.  He still remained very calm and would not open his mouth.  It was at this time that I suggested hand expression.  Mother's breasts are soft and non tender.  The left nipple is everted but short shafted and the right nipple is more flat and inverts with hand expression.  Demonstrated hand expression and mother was able to express easily from the left breast but no colostrum noted from the right breast.  Mother hand expressed 1 ml of colostrum and I spoon fed this to baby.    Mother wanting to rest when baby is in nursery; will suggest to RN to have mother begin pumping when she awakens if baby is still in nursery.  Mother has no further questions/concerns via the interpreter and will call for assistance as needed.   Maternal Data Formula Feeding for Exclusion: No Has patient been taught Hand Expression?: Yes Does the patient have breastfeeding experience prior to this delivery?: No  Feeding Feeding  Type: Breast Fed Length of feed: 0 min  LATCH Score Latch: Too sleepy or reluctant, no latch achieved, no sucking elicited.  Audible Swallowing: None  Type of Nipple: Everted at rest and after stimulation  Comfort (Breast/Nipple): Soft / non-tender  Hold (Positioning): Assistance needed to correctly position infant at breast and maintain latch.  LATCH Score: 5  Interventions Interventions: Breast feeding basics reviewed;Assisted with latch;Skin to skin;Breast massage;Hand express;Position options;Support pillows;Adjust position;Breast compression  Lactation Tools Discussed/Used WIC Program: No   Consult Status Consult Status: Follow-up Date: 10/02/17 Follow-up type: In-patient    Emri Sample R Jessia Kief 10/01/2017, 12:52 AM

## 2017-10-02 ENCOUNTER — Ambulatory Visit: Payer: Self-pay

## 2017-10-02 ENCOUNTER — Other Ambulatory Visit: Payer: Self-pay

## 2017-10-02 MED ORDER — IBUPROFEN 600 MG PO TABS: 600.0000 mg | ORAL_TABLET | Freq: Four times a day (QID) | ORAL | 0 refills | Status: DC

## 2017-10-02 NOTE — Lactation Note (Addendum)
Lactation Consultation Note  Patient Name: Lisa StallsClaudia Penafiel Hodge [name]  Today's Date: 10/02/2017 Reason for consult: Follow-up assessment;Infant weight loss;Primapara;1st time breastfeeding;Difficult latch;Term(8% weight loss, )  As LC entered the room 2 RN assisting with mom to latch. [place] interpreter [name] [phone] interpreting via I-pad.  RN attempting NS #20, and also STS, baby noted to be on and off and not sustaining a latch longer than a few sucks.  LC stepped  in and assisted and experienced the same on and off pattern. Baby only sustained a latch 5 mins at a time with swallows,  Flanged lips noted, recessed chin and high palate.  LC assessed with a gloved finger and allowed baby  to suck and  finger feed EBM and tongue noted to stay down.  LC also used EBM and spoon fed, total 7 ml EBM .  Baby much calmer after wet diaper change, and received E#BM , attempted again to latch and baby was on and off and did not seem  Interested and fell asleep STS with mom.   Through out the consult LC reviewed basic breast feeding teaching. And reassured mom and dad latching takes time and it is great she can hand  Express and pump off EBM . Mom and dad seem calmer and pleased with feeding.     Maternal Data Has patient been taught Hand Expression?: Yes Does the patient have breastfeeding experience prior to this delivery?: No  Feeding Feeding Type: Breast Milk Length of feed: (over 40 mins span of time, baby was latching and - relatching to the breast - see LC note )  LATCH Score Latch: Repeated attempts needed to sustain latch, nipple held in mouth throughout feeding, stimulation needed to elicit sucking reflex.(on and off - see LC note )  Audible Swallowing: Spontaneous and intermittent  Type of Nipple: Everted at rest and after stimulation  Comfort (Breast/Nipple): Soft / non-tender  Hold (Positioning): Assistance needed to correctly position infant at breast and  maintain latch.  LATCH Score: 8  Interventions Interventions: Breast feeding basics reviewed;Assisted with latch;Skin to skin;Breast massage;Hand express;Pre-pump if needed;Breast compression;Adjust position;Support pillows;Position options;Expressed milk;Hand pump;DEBP  Lactation Tools Discussed/Used Tools: Pump;Nipple Shields Nipple shield size: 20;24;Other (comment)(RN started the NS #20 / and LC resized t6o #24 , baby unable to get depth ) Breast pump type: Double-Electric Breast Pump;Manual Pump Review: Setup, frequency, and cleaning;Milk Storage(hand pump already set up and DEBP started at consult ) Initiated by:: MAI  Date initiated:: 10/02/17   Consult Status Consult Status: Follow-up Date: 10/02/17 Follow-up type: In-patient    Matilde SprangMargaret Ann Ajai Terhaar 10/02/2017, 12:55 PM

## 2017-10-02 NOTE — Discharge Instructions (Signed)
Parto normal, cuidados aps o procedimento Vaginal Delivery, Care After Consulte este folheto nas prximas semanas. Essas instrues fornecem informaes sobre como cuidar de si mesma aps um parto normal. Seu mdico tambm poder fornecer instrues mais especficas. Seu tratamento foi planejado de acordo com as prticas mdicas atuais, mas s vezes problemas podem ocorrer. Ligue para o seu mdico caso tenha qualquer Computer Sciences Corporationum dos problemas ou dvidas a seguir. O que posso esperar aps o procedimento? Aps um parto normal,  comum ocorrer:  Algum sangramento pela vagina.  Dor no abdome, na vagina e na pele entre a Print production plannerabertura da vagina e o nus (perneo).  Clicas plvicas.  Fadiga.  Siga essas instrues em casa: Medicamentos  Tome medicamentos vendidos com ou sem receita mdica somente de acordo com as indicaes do seu mdico.  Caso tenha recebido uma prescrio de antibitico, tome-o somente como determinado pelo seu mdico. No pare de tomar o antibitico at [facility] ter terminado. Direo   No dirija nem opere mquinas pesadas enquanto estiver tomando analgsicos vendidos com receita mdica.  No dirija por 24 horas caso tenha recebido um sedativo. Estilo de vida  No beba lcool. Isso ser especialmente importante caso voc esteja amamentando ou tomando medicamentos para Paramedicaliviar a dor.  No use derivados do tabaco, incluindo cigarros, tabaco de mascar ou cigarros eletrnicos. Caso precise de ajuda para parar de fumar, fale com seu mdico. Alimentos e bebidas  Beba pelo menos oito copos de 8 onas de gua todos os dias a menos que seu mdico diga para voc no fazer isso. Caso opte por amamentar seu beb, voc poder precisar beber Brink's Companymais gua ainda.  Consuma alimentos com elevado teor de fibras todos os dias. Esses alimentos podero ajudar a Agricultural consultantprevenir ou aliviar a constipao. Alimentos ricos em fibras incluem: ? Cereais e pes integrais. ? Arroz integral. ? Feijes. ? Frutas e legumes  frescos. Atividades  Retorne s suas atividades normais de acordo com as orientaes do seu mdico. Pergunte ao seu mdico quais atividades so seguras para voc.  Repouse o mximo possvel. Tente descansar ou tirar uma soneca quando seu beb estiver dormindo.  No levante nada que seja mais pesado que o seu beb ou pese mais de 10 libras (4,5 kg) at BellSouthseu mdico dizer que  seguro faz-lo.  Converse com seu mdico sobre quando voc poder ter relaes sexuais. Isso poder depender: ? Do risco de infeces. ? Da velocidade Biomedical engineerda cicatrizao. ? Do conforto e do desejo de realizar atividade sexual. Elderonomo cuidar da vagina  Caso tenha realizado episiotomia ou sofrido lacerao vaginal, verifique a rea todos os dias em busca de sinais de infeco. Verifique a ocorrncia de: ? Aumento da vermelhido, do Film/video editorinchao ou da dor. ? Aumento da quantidade de lquido ou sangue. ? Calor. ? Pus ou mau cheiro.  No use absorventes internos nem faa duchas vaginais at o seu mdico dizer que  seguro faz-lo.  Fique atenta para cogulos que possam ser expelidos pela sua vagina. Esses cogulos podem se parecer com acmulos de secreo de cor vermelha escura, marrom ou preta. Instrues gerais  Mantenha seu perneo limpo e seco de acordo com as orientaes do seu mdico.  Use roupas confortveis e largas.  Limpe-se com movimentos da frente para trs aps usar o vaso sanitrio.  Pergunte ao seu mdico se voc pode tomar banho. Caso tenha realizado episiotomia ou sofrido lacerao perineal durante o trabalho de parto ou durante o parto, seu mdico poder lhe dizer para no tomar banhos por um determinado perodo.  Use um suti que oferece apoio aos seus seios e seja do tamanho adequado.  Se possvel, pea a algum para ajudar voc com as tarefas domsticas e ajude a tomar conta do beb por pelo menos alguns dias depois que voc deixar o hospital.  Comparea a todas as consultas de acompanhamento suas e do  beb de acordo com as orientaes do seu mdico. Isso  importante. Entre em contato com um mdico se:  Voc apresentar: ? Secreo vaginal com cheiro ruim. ? Dificuldade para urinar. ? Dor ao urinar. ? Aumento ou reduo sbita da Air Products and Chemicalsfrequncia das suas defecaes. ? Aumento da vermelhido, do inchao ou da dor na episiotomia ou lacerao vaginal. ? Mais lquido ou sangue saindo da episiotomia ou lacerao vaginal. ? Pus ou cheiro ruim oriundo da episiotomia ou lacerao vaginal. ? Danna HeftyFebre. ? Uma erupo cutnea. ? Pouco ou nenhum interesse nas atividades de que voc Orangeburggostava. ? Dvidas sobre como cuidar de voc mesma ou do beb.  Sua episiotomia ou lacerao vaginal parecer quente ao toque.  Sua episiotomia ou lacerao vaginal se abrir e no Visual merchandiserparecer estar cicatrizando.  Suas mamas ficarem doloridas, duras ou avermelhadas.  Voc se sentir incomumente triste ou preocupada.  Sentir enjoo ou vomitar.  Expelir grandes cogulos de sangue pela vagina. Caso isso acontea, guarde os cogulos de sua vagina para mostr-los ao seu mdico. No elimine cogulos no vaso sanitrio sem que seu mdico os examine.  Urinar mais do que o normal.  Sentir tontura ou vertigem.  No tiver amamentado nenhuma vez e no menstruar por 12 semanas aps o parto.  Tiver parado de amamentar e no menstruar por 12 semanas aps parar de amamentar. Tomasa Hosebtenha ajuda imediatamente se:  Voc apresentar: ? Dor que no desaparece ou no melhora com o medicamento. ? Dor no peito. ? Dificuldade para respirar. ? Viso embaada ou manchas na viso. ? Pensar em fazer mal a si mesma ou ao seu beb.  Surgirem dores no abdome ou em Parker Hannifinuma das pernas.  Sentir dor de cabea intensa.  Desmaiar.  Sangrar muito pela vagina a ponto de usar dois absorventes em uma hora. Estas informaes no se destinam a substituir as recomendaes de seu mdico. No deixe de discutir quaisquer dvidas com seu mdico. Document Released:  04/09/2005 Document Revised: 08/01/2016 Document Reviewed: 04/24/2015 Elsevier Interactive Patient Education  2018 [employer]

## 2017-10-02 NOTE — Discharge Summary (Signed)
OB Discharge Summary     Patient Name: Lisa Hodge DOB: 12-06-1985 MRN: [id number]  Date of admission: 09/29/2017 Delivering MD: Dorathy Kinsman   Date of discharge: 10/02/2017  Admitting diagnosis: 40 WKS, WATER BROKE Intrauterine pregnancy: [redacted]w[redacted]d     Secondary diagnosis:  Active Problems:   Leakage of amniotic fluid  Additional problems: none     Discharge diagnosis: Term Pregnancy Delivered                                                                                                Post partum procedures:none  Augmentation: Pitocin and Foley Balloon  Complications: None  Hospital course:  Onset of Labor With Vaginal Delivery     32 y.o. yo G1P1001 at [redacted]w[redacted]d was admitted in Latent Labor after SROM with no contractions on 09/29/2017. Patient had an uncomplicated labor course as follows:  Membrane Rupture Time/Date: 9:00 PM ,09/28/2017   Intrapartum Procedures: Episiotomy: None [1]                                         Lacerations:  2nd degree [3];Labial [10]  Patient had a delivery of a Viable infant. 09/30/2017  Information for the patient's newborn:  Genella, Bas [[id number]]  Delivery Method: Vaginal, Spontaneous(Filed from Delivery Summary)    Pateint had an uncomplicated postpartum course.  She is ambulating, tolerating a regular diet, passing flatus, and urinating well. Patient is discharged home in stable condition on 10/02/17.   Physical exam  Vitals:   09/30/17 1944 10/01/17 0343 10/01/17 1449 10/02/17 0000  BP: 102/74 95/60 (!) 94/57 (!) 81/69  Pulse: 89 70 83 81  Resp: 18 18 18 18   Temp: 98.6 F (37 C) 98.2 F (36.8 C) 98.2 F (36.8 C) 98.7 F (37.1 C)  TempSrc: Axillary Oral Oral Oral  Weight:      Height:       General: alert, cooperative and no distress Lochia: appropriate Uterine Fundus: firm Incision: N/A DVT Evaluation: No evidence of DVT seen on physical exam. Labs: Lab Results  Component Value Date   WBC 15.3  (H) 09/30/2017   HGB 12.0 09/30/2017   HCT 35.3 (L) 09/30/2017   MCV 90.1 09/30/2017   PLT 107 (L) 09/30/2017   No flowsheet data found.  Discharge instruction: per After Visit Summary and "Baby and Me Booklet".  After visit meds:  Allergies as of 10/02/2017      Reactions   Latex Rash      Medication List    TAKE these medications   calcium acetate 667 MG capsule Commonly known as:  PHOSLO Take 667 mg by mouth 3 (three) times daily with meals.   ibuprofen 600 MG tablet Commonly known as:  ADVIL,MOTRIN Take 1 tablet (600 mg total) by mouth every 6 (six) hours.   multivitamin-prenatal 27-0.8 MG Tabs tablet Take 1 tablet by mouth daily at 12 noon.       Diet: routine diet  Activity: Advance as tolerated. Pelvic rest  for 6 weeks.   Outpatient follow up:4 weeks Follow up Appt:No future appointments. Follow up Visit:No follow-ups on file.  Postpartum contraception: Undecided  Newborn Data: Live born female  Birth Weight: 7 lb 1.1 oz (3206 g) APGAR: 8, 9  Newborn Delivery   Birth date/time:  09/30/2017 11:56:00 Delivery type:  Vaginal, Spontaneous     Baby Feeding: Bottle and Breast Disposition:home with mother   10/02/2017 Wynelle BourgeoisMarie Yama Nielson, CNM

## 2017-10-02 NOTE — Lactation Note (Signed)
This note was copied from a baby's chart. Lactation Consultation Note  Patient Name: Lisa Nolon StallsClaudia Penafiel Cedeno RUEAV'WToday's Date: 10/02/2017 Reason for consult: Follow-up assessment;Difficult latch;Other (Comment)(RN Request)  P1 mother who continues to need assistance with latch  Infant being put on phototherapy with RN in room and Spanish interpreter at bedside.  Baby is fussy and crying.  He has been a very difficult latch so I suggested that we calm him first before attempting to latch.  Mother had 8 mls of EBM so I finger fed with a curved tip syringe while mother held baby.  His suck has improved and he has learned to bring his tongue down.  He was a little more calm after this so I suggested attempting to latch and mother accepted.  After a few attempts he latched onto the right breast in the football hold.  Via interpreter, reminded mother to keep fingers back from areola during latch to sustain a deeper latch.  He sucked only a couple times before becoming frustrated and pulling off.  Relatched again and he did the same pattern of a couple of sucks and pulling back off breast.  Using the 5 FR feeding tube with formula he was able to stay at the breast and suck an additional 20 mls of formula.  Mother did not complain of any pain.  Baby was able to suck and pull the formula from the syringe with [facility].  Burped well and put under phototherapy byRN.  Mother will continue this pattern throughout the night.  She will pump for 15 minutes after every feeding and save any EBM she obtains to finger feed with the curved tip syringe prior to the next latch.  This seemed to work to keep him content for an easier latch.  Mother had no further questions/concerns at this time.  RN to call me for assistance as needed.     Maternal Data Formula Feeding for Exclusion: No Has patient been taught Hand Expression?: Yes Does the patient have breastfeeding experience prior to this  delivery?: No  Feeding Feeding Type: Breast Fed Length of feed: 10 min  LATCH Score Latch: Repeated attempts needed to sustain latch, nipple held in mouth throughout feeding, stimulation needed to elicit sucking reflex.  Audible Swallowing: A few with stimulation  Type of Nipple: Everted at rest and after stimulation(short shafted)  Comfort (Breast/Nipple): Soft / non-tender  Hold (Positioning): Assistance needed to correctly position infant at breast and maintain latch.  LATCH Score: 7  Interventions Interventions: Breast feeding basics reviewed;Assisted with latch;Skin to skin;Breast massage;Hand express;Position options;Support pillows;Adjust position;Breast compression;Expressed milk;DEBP  Lactation Tools Discussed/Used Nipple shield size: 24   Consult Status Consult Status: Follow-up Date: 10/03/17 Follow-up type: In-patient    Dora SimsBeth R Jakari Jacot 10/02/2017, 9:48 PM

## 2017-10-03 ENCOUNTER — Encounter: Payer: Medicaid Other | Admitting: Student

## 2017-10-03 ENCOUNTER — Ambulatory Visit: Payer: Self-pay

## 2017-10-03 NOTE — Lactation Note (Signed)
This note was copied from a baby's chart. Lactation Consultation Note  Patient Name: Lisa Hodge GMWNU'UToday's Date: 10/03/2017   [facility]:  Interpreter [phone]#[id number] used for the entire conversation.  Feeding plan has been going well with collaboration with RN and LC so will continue this routine.  Mother had pumped 8 mls of colostrum after last feeding which I finger fed back to baby with the curved tip syringe.  Baby sucked effectively and was calm after feeding.  Attempted to latch onto the left breast in the football hold.  Baby could latch but did not suck.  He would release and pull off the breast. He was very fussy during this attempt. Placed #20 NS to breast and attempted to latch.  Again, he would latch with some difficulty but would not suck.  With RN assistance we used a #5 FR feeding tube inserted into the NS and baby had a latch with a good rhythmic suck.  He did not pull back and was content sucking at the breast.  With that immediate gratification of the formula he was calm.  Increased his formula volume now that he is 63 hours of age.    Mother will continue to pump after feedings and the colostrum obtained will be used for the next feeding.  If baby is calm upon awakening for the next feeding I suggested letting him go straight to the breast first, with or without the NS depending upon mother's preference.  The colostrum will be fed first followed by appropriate formula supplementation.  Via the interpreter, I asked mother if she felt comfortable enough that she and the father could perform this feeding routine without help.  She stated she will do this.  Father slept the entire time I was in the room.  Mother had no further questions/concerns at this time.  Mother will call for assistance as needed and RN will check on family at the next feeding time.  Infant placed back on phototherapy by RN at the completion of the feed.  He is resting quietly at this  time.                     Kally Cadden R [name] 10/03/2017, 3:55 AM

## 2017-11-14 ENCOUNTER — Ambulatory Visit (INDEPENDENT_AMBULATORY_CARE_PROVIDER_SITE_OTHER): Payer: Medicaid Other | Admitting: Student

## 2017-11-14 ENCOUNTER — Encounter: Payer: Self-pay | Admitting: Student

## 2017-11-14 DIAGNOSIS — Z1389 Encounter for screening for other disorder: Secondary | ICD-10-CM

## 2017-11-14 NOTE — Progress Notes (Signed)
Subjective:     Lisa Hodge is a 32 y.o. female who presents for a postpartum visit. She is 6 weeks postpartum following a spontaneous vaginal delivery. I have fully reviewed the prenatal and intrapartum course. The delivery was at 39/5 gestational weeks. Outcome: spontaneous vaginal delivery. Anesthesia: lidocain. Postpartum course has been uneventful. Baby's course has been uneventful. Baby is feeding by bottle - gerber Gentle. Bleeding no bleeding. Bowel function is normal. Bladder function is normal. Patient is not sexually active. Contraception method is none. Postpartum depression screening: negative.  The following portions of the patient's history were reviewed and updated as appropriate: allergies, current medications, past family history, past medical history, past social history, past surgical history and problem list.  Review of Systems Pertinent items are noted in HPI.   Objective:    BP 93/74   Pulse 71   Ht 5\' 6"  (1.676 m)   Wt 136 lb (61.7 kg)   Breastfeeding? No Comment: breast sometimes  BMI 21.95 kg/m   General:  alert, cooperative and no distress   Breasts:  inspection negative, no nipple discharge or bleeding, no masses or nodularity palpable  Lungs: clear to auscultation bilaterally  Heart:  regular rate and rhythm, S1, S2 normal, no murmur, click, rub or gallop  Abdomen: soft, non-tender; bowel sounds normal; no masses,  no organomegaly   Vulva:  not evaluated  Vagina: not evaluated  Cervix:  not evaluated  Corpus: not examined  Adnexa:  not evaluated  Rectal Exam: Not performed.        Assessment:    Healthy postpartum exam. Pap smear not done at today's visit.   Plan:    1. Contraception: condoms 2. Patient doing well; no complaints. Planning to [place] back to [place] with her [place].  3. Follow up in: 1 year or as needed.

## 2017-11-14 NOTE — Progress Notes (Signed)
Pt states when she starts to have sex she will be using Condoms. Does not want any thing else at this time.

## 2018-04-23 NOTE — L&D Delivery Note (Addendum)
Delivery Note At 7:03 PM a viable female was delivered via Vaginal, Spontaneous (Presentation: Spontaneous; LOA).  APGAR: 9, 9; weight see delivery summary.   Placenta status: Spontaneous, intact.  Cord: 3 vessel with the following complications: nuchal x1 (not ligated or reduced).   Anesthesia: Epidural  Episiotomy: None Lacerations: 1st degree;Perineal Suture Repair: 3.0 vicryl rapide Est. Blood Loss (mL): 10  Mom to postpartum.  Baby to Couplet care / Skin to Skin.  Gerlene Fee, DO Family Medicine Resident, PGY-1 11/16/2018, 7:36 PM  I was present for the entire delivery of baby and placenta and performed the repair and agree with above.  Tamala Julian, [place], [place] 11/16/2018 7:47 PM

## 2018-07-21 ENCOUNTER — Telehealth: Payer: Self-pay | Admitting: Family Medicine

## 2018-07-21 ENCOUNTER — Telehealth: Payer: Self-pay | Admitting: Obstetrics and Gynecology

## 2018-07-21 NOTE — Telephone Encounter (Signed)
The patient called per a voicemail she was left from earlier today. She stated the voicemail stated she could come in tomorrow. Educated the patient of the new ob intake. She stated her medical records in [place]. When asked did she have them or if she could retrieve them she stated no. Informed the patient of the medical records being on file before the appointment can be scheduled. The patient also stated she is 23 weeks and has only been to the doctor in [place] once. Stated the new ob intake was to far away as she wanted something sooner. Continued to state she was promised an appointment tomorrow with Samara Deist. After speaking with Bennie Hind she stated she received an in-basket about the patient being 26 weeks and schedule an appointment asap with Samara Deist. No ob intake.

## 2018-07-21 NOTE — Telephone Encounter (Signed)
Attempted to call patient with the Spanish interpreter Lisa Hodge to get her scheduled to get her prenatal care started. No answer, lvm to give the office a call back to get scheduled.

## 2018-07-23 ENCOUNTER — Telehealth: Payer: Self-pay | Admitting: Family Medicine

## 2018-07-23 NOTE — Telephone Encounter (Signed)
Called patient with Interpreter [phone] to inform her of appointment change. VM was left.

## 2018-07-25 ENCOUNTER — Encounter: Payer: Self-pay | Admitting: Advanced Practice Midwife

## 2018-07-25 ENCOUNTER — Other Ambulatory Visit: Payer: Self-pay

## 2018-07-25 ENCOUNTER — Ambulatory Visit (INDEPENDENT_AMBULATORY_CARE_PROVIDER_SITE_OTHER): Payer: Medicaid Other | Admitting: Advanced Practice Midwife

## 2018-07-25 VITALS — BP 109/77 | HR 90 | Wt 152.3 lb

## 2018-07-25 DIAGNOSIS — Z3A23 23 weeks gestation of pregnancy: Secondary | ICD-10-CM

## 2018-07-25 DIAGNOSIS — Z23 Encounter for immunization: Secondary | ICD-10-CM

## 2018-07-25 DIAGNOSIS — Z3480 Encounter for supervision of other normal pregnancy, unspecified trimester: Secondary | ICD-10-CM | POA: Diagnosis not present

## 2018-07-25 DIAGNOSIS — Z363 Encounter for antenatal screening for malformations: Secondary | ICD-10-CM

## 2018-07-25 DIAGNOSIS — Z3483 Encounter for supervision of other normal pregnancy, third trimester: Secondary | ICD-10-CM

## 2018-07-25 NOTE — Progress Notes (Signed)
   PRENATAL VISIT NOTE  Subjective:  Lisa Hodge is a 33 y.o. G1P1001 at Unknown being seen today for New OB visit. Limited prenatal care in [place]. She is currently monitored for the following issues for this low-risk pregnancy and has Supervision of other normal pregnancy, antepartum on their problem list.  Patient reports no complaints and in very anxious about impact of COVID-19 on her pregnancy. .  Contractions: Not present. Vag. Bleeding: None.  Movement: Present. Denies leaking of fluid.   The following portions of the patient's history were reviewed and updated as appropriate: allergies, current medications, past family history, past medical history, past social history, past surgical history and problem list.   Objective:   Vitals:   07/25/18 1110  BP: 109/77  Pulse: 90  Weight: 152 lb 4.8 oz (69.1 kg)    Fetal Status: Fetal Heart Rate (bpm): 161 Fundal Height: 25 cm Movement: Present     General:  Alert, oriented and cooperative. Patient is in no acute distress.  Skin: Skin is warm and dry. No rash noted.   Cardiovascular: Normal heart rate noted  Respiratory: Normal respiratory effort, no problems with respiration noted  Abdomen: Soft, gravid, appropriate for gestational age.  Pain/Pressure: Absent     Pelvic: Cervical exam deferred        Extremities: Normal range of motion.  Edema: None  Mental Status: Normal mood and affect. Normal behavior. Normal judgment and thought content.   Assessment and Plan:  Pregnancy: G1P1001 at Unknown 1. Supervision of other normal pregnancy, antepartum  - [place] MFM OB COMP + 14 WK; Future - Flu Vaccine QUAD 36+ mos IM - Culture, OB Urine - [facility] AMB BABYSCRIPTS SCHEDULE OPTIMIZATION - requests to get NOB Panel drawn with GTT at [place].   2. Antenatal screening for malformation using ultrasonics  - [place] MFM OB COMP + 14 WK; Future - [facility] AMB BABYSCRIPTS SCHEDULE OPTIMIZATION  3. Language barrier - Video interpreter used.   Preterm labor symptoms and general obstetric precautions including but not limited to vaginal bleeding, contractions, leaking of fluid and fetal movement were reviewed in detail with the patient. Please refer to After Visit Summary for other counseling recommendations.   Return in about 4 weeks (around 08/22/2018) for ROB/GTT.  Future Appointments  Date Time Provider Department Center  08/06/2018  8:15 AM WH-MFC [place] 4 WH-MFCUS MFC-US  08/22/2018  8:50 AM WOC-WOCA LAB WOC-WOCA WOC    Dorathy Kinsman, [place]

## 2018-07-25 NOTE — Patient Instructions (Signed)
Segundo trimestre de embarazo  Second Trimester of Pregnancy  El segundo trimestre va desde la semana14 hasta la 27, desde el cuarto hasta el sexto mes, y suele ser el momento en el que mejor se siente. Su organismo se ha adaptado a estar embarazada, y comienza a sentirse fsicamente mejor. En general, las nuseas matutinas han disminuido o han desaparecido completamente, puede tener ms energa y un aumento de apetito. El segundo trimestre es tambin la poca en la que el feto se desarrolla rpidamente. Hacia el final del sexto mes, el feto mide aproximadamente 9pulgadas (23cm) y pesa alrededor de 1 libras (700g). Es probable que sienta que el beb se mueve (da pataditas) entre las 16 y 20semanas del embarazo.  Cambios en el cuerpo durante el segundo trimestre  Su cuerpo continua experimentando numerosos cambios durante su segundo trimestre. Estos cambios varan de una mujer a otra.   Seguir aumentando de peso. Notar que la parte baja del abdomen sobresale.   Podrn aparecer las primeras estras en las caderas, el abdomen y las mamas.   Es posible que tenga dolores de cabeza que pueden aliviarse con ciertos medicamentos. Los medicamentos que tome deben estar aprobados por el mdico.   Tal vez tenga necesidad de orinar con ms frecuencia porque el feto est ejerciendo presin sobre la vejiga.   Debido al embarazo podr sentir acidez estomacal con frecuencia.   Puede estar estreida, ya que ciertas hormonas enlentecen los movimientos de los msculos que empujan los desechos a travs de los intestinos.   Pueden aparecer hemorroides o abultarse e hincharse las venas (venas varicosas).   Puede sentir dolor en la espalda. Esto se debe a:  ? Aumento de peso.  ? Las hormonas del embarazo relajan las articulaciones en la pelvis.  ? Un cambio en el peso y los msculos que ayudan a mantener su equilibrio.   Sus pechos seguirn creciendo y se pondrn cada vez ms sensibles.   Las encas pueden sangrar y estar  sensibles al cepillado y al hilo dental.   Pueden aparecer zonas oscuras o manchas (cloasma, mscara del embarazo) en el rostro. Esto probablemente se atenuar despus del nacimiento del beb.   Es posible que se forme una lnea oscura desde el ombligo hasta la zona del pubis (linea nigra). Esto probablemente se atenuar despus del nacimiento del beb.   Tal vez haya cambios en el cabello. Esto cambios pueden incluir su engrosamiento, crecimiento rpido y cambios en la textura. Adems, a algunas mujeres se les cae el cabello durante o despus del embarazo, o tienen el cabello seco o fino. Lo ms probable es que el cabello se le normalice despus del nacimiento del beb.  Qu debe esperar en las visitas prenatales  Durante una visita prenatal de rutina:   La pesarn para asegurarse de que usted y el feto estn creciendo normalmente.   Le tomarn la presin arterial.   Le medirn el abdomen para controlar el desarrollo del beb.   Se escucharn los latidos cardacos fetales.   Se evaluarn los resultados de los estudios solicitados en visitas anteriores.  El mdico puede preguntarle lo siguiente:   Cmo se siente.   Si siente los movimientos del beb.   Si ha tenido sntomas anormales, como prdida de lquido, sangrado, dolores de cabeza intensos o clicos abdominales.   Si est consumiendo algn producto que contenga tabaco, como cigarrillos, tabaco de mascar y cigarrillos electrnicos.   Si tiene alguna pregunta.  Otros estudios que podrn realizarse durante el   segundo trimestre incluyen lo siguiente:   Anlisis de sangre para detectar lo siguiente:  ? Concentraciones de hierro bajas (anemia).  ? Nivel alto de azcar en la sangre que afecta a las mujeres embarazadas (diabetes gestacional) entre las semanas 24 y 28.  ? Anticuerpos Rh. Esto es para detectar una protena en los glbulos rojos (factor Rh).   Anlisis de orina para detectar infecciones, diabetes o protenas en la orina.   Una ecografa  para confirmar que el beb crece y se desarrolla correctamente.   Una amniocentesis para diagnosticar posibles problemas genticos.   Estudios del feto para descartar espina bfida y sndrome de Down.   Prueba del VIH (virus de inmunodeficiencia humana). Los exmenes prenatales de rutina incluyen la prueba de deteccin del VIH, a menos que decida no realizrsela.  Siga estas indicaciones en su casa:  Medicamentos   Siga las indicaciones del mdico en relacin con el uso de medicamentos. Durante el embarazo, hay medicamentos que pueden tomarse y otros que no.   Tome vitaminas prenatales que contengan por lo menos 600microgramos (?g) de cido flico.   Si est estreida, tome un laxante suave, si el mdico lo autoriza.  Qu debe comer y beber     Lleve una dieta equilibrada que incluya gran cantidad de frutas y verduras frescas, cereales integrales, buenas fuentes de protenas como carnes magras, huevos o tofu, y lcteos descremados. El mdico la ayudar a determinar la cantidad de peso que puede aumentar.   No coma carne cruda ni quesos sin cocinar. Estos elementos contienen grmenes que pueden causar defectos congnitos en el beb.   Si no consume muchos alimentos con calcio, hable con su mdico sobre si debera tomar un suplemento diario de calcio.   Limite el consumo de alimentos con alto contenido de grasas y azcares procesados, como alimentos fritos o dulces.   Para evitar el estreimiento:  ? Bebe suficiente lquido para mantener la orina clara o de color amarillo plido.  ? Consuma alimentos ricos en fibra, como frutas y verduras frescas, cereales integrales y frijoles.  Actividad   Haga ejercicio solamente como se lo haya indicado el mdico. La mayora de las mujeres pueden continuar su rutina de ejercicios durante el embarazo. Intente realizar como mnimo 30minutos de actividad fsica por lo menos 5das a la semana. Deje de hacer ejercicio si experimenta contracciones uterinas.   No levante  objetos pesados, use zapatos de tacones bajos y mantenga una buena postura.   Puede seguir manteniendo relaciones sexuales, a menos que el mdico le indique lo contrario.  Alivio del dolor y del malestar   Use un sostn que le brinde buen soporte para prevenir las molestias causadas por la sensibilidad en los pechos.   Dese baos de asiento con agua tibia para aliviar el dolor o las molestias causadas por las hemorroides. Use una crema para las hemorroides si el mdico la autoriza.   Descanse con las piernas elevadas si tiene calambres o dolor de cintura.   Si tiene venas varicosas, use medias de descanso. Eleve los pies durante 15minutos, 3 o 4veces por da. Limite el consumo de sal en su dieta.  Cuidados prenatales   Escriba sus preguntas. Llvelas cuando concurra a las visitas prenatales.   Concurra a todas las visitas prenatales tal como se lo haya indicado el mdico. Esto es importante.  Seguridad   Use el cinturn de seguridad en todo momento mientras conduce.   Haga una lista de los nmeros de telfono de emergencia, que   incluya los nmeros de telfono de familiares, amigos, el hospital y los departamentos de polica y bomberos.  Instrucciones generales   Pdale al mdico que la derive a clases de educacin prenatal en su localidad. Debe comenzar a tomar las clases antes de que empiece el mes6 de embarazo.   Pida ayuda si tiene necesidades nutricionales o de asesoramiento durante el embarazo. El mdico puede aconsejarla o derivarla a especialistas para que la ayuden con diferentes necesidades.   No se d baos de inmersin en agua caliente, baos turcos ni saunas.   No se haga duchas vaginales ni use tampones o toallas higinicas perfumadas.   No mantenga las piernas cruzadas durante mucho tiempo.   Evite el contacto con las bandejas sanitarias de los gatos y la tierra que estos animales usan. Estos elementos contienen bacterias que pueden causar defectos congnitos al beb y la posible  prdida del feto debido a un aborto espontneo o muerte fetal.   Evite fumar, consumir hierbas, beber alcohol y tomar frmacos que no le hayan recetado. Las sustancias qumicas que estos productos contienen pueden afectar la formacin y el desarrollo del beb.   No consuma ningn producto que contenga nicotina o tabaco, como cigarrillos y cigarrillos electrnicos. Si necesita ayuda para dejar de fumar, consulte al mdico.   Visite a su dentista si an no lo ha hecho durante el embarazo. Use un cepillo de dientes blando para higienizarse los dientes y psese el hilo dental con suavidad.  Comunquese con un mdico si:   Tiene mareos.   Siente clicos leves, presin en la pelvis o dolor persistente en el abdomen.   Tiene nuseas, vmitos o diarrea persistentes.   Observa una secrecin vaginal con mal olor.   Siente dolor al orinar.  Solicite ayuda de inmediato si:   Tiene fiebre.   Tiene una prdida de lquido por la vagina.   Tiene sangrado o pequeas prdidas vaginales.   Siente dolor intenso o clicos en el abdomen.   Sube de peso o baja de peso rpidamente.   Tiene dificultad para respirar y siente dolor de pecho.   Sbitamente se le hinchan mucho el rostro, las manos, los tobillos, los pies o las piernas.   No ha sentido los movimientos del beb durante una hora.   Siente un dolor de cabeza intenso que no se alivia al tomar medicamentos.   Nota cambios en la visin.  Resumen   El segundo trimestre va desde la semana14 hasta la 27, desde el cuarto hasta el sexto mes. Es tambin una poca en la que el feto se desarrolla rpidamente.   Su organismo atraviesa por muchos cambios durante el embarazo. Estos cambios varan de una mujer a otra.   Evite fumar, consumir hierbas, beber alcohol y tomar frmacos que no le hayan recetado. Estas sustancias qumicas afectan la formacin y el desarrollo de su beb.   No consuma ningn producto que contenga tabaco, lo que incluye cigarrillos, tabaco de mascar  y cigarrillos electrnicos. Si necesita ayuda para dejar de fumar, consulte al mdico.   Comunquese con su mdico si tiene preguntas sobre esto. Concurra a todas las visitas prenatales tal como se lo haya indicado el mdico. Esto es importante.  Esta informacin no tiene como fin reemplazar el consejo del mdico. Asegrese de hacerle al mdico cualquier pregunta que tenga.  Document Released: 01/17/2005 Document Revised: 08/20/2016 Document Reviewed: 08/20/2016  Elsevier Interactive Patient Education  2019 [employer]

## 2018-07-25 NOTE — Progress Notes (Signed)
Given Omron bp cuff. Instructed how to use bp cuff and had patient return demonstration. Had patient download [employer] app and register.  Instructed how to enter blood pressure; then had patient enter the blood pressure she took today.  Asked her to call us if she has any issues with entering blood pressure. Instructed to take blood pressure weekly. Also instructed to call us if blood pressure 140/90 or higher - either number.  She voces understanding. Linda,RN

## 2018-07-29 ENCOUNTER — Encounter: Payer: Medicaid Other | Admitting: Student

## 2018-08-06 ENCOUNTER — Ambulatory Visit (HOSPITAL_COMMUNITY)
Admission: RE | Admit: 2018-08-06 | Discharge: 2018-08-06 | Disposition: A | Discharge status: Home / Self Care | Payer: Medicaid Other | Source: Ambulatory Visit | Attending: Advanced Practice Midwife | Admitting: Advanced Practice Midwife

## 2018-08-06 ENCOUNTER — Other Ambulatory Visit: Payer: Self-pay

## 2018-08-06 DIAGNOSIS — O0932 Supervision of pregnancy with insufficient antenatal care, second trimester: Secondary | ICD-10-CM

## 2018-08-06 DIAGNOSIS — Z3A25 25 weeks gestation of pregnancy: Secondary | ICD-10-CM | POA: Diagnosis not present

## 2018-08-06 DIAGNOSIS — Z363 Encounter for antenatal screening for malformations: Secondary | ICD-10-CM | POA: Diagnosis not present

## 2018-08-06 DIAGNOSIS — O09892 Supervision of other high risk pregnancies, second trimester: Secondary | ICD-10-CM | POA: Diagnosis not present

## 2018-08-06 DIAGNOSIS — Z3480 Encounter for supervision of other normal pregnancy, unspecified trimester: Secondary | ICD-10-CM | POA: Insufficient documentation

## 2018-08-11 ENCOUNTER — Telehealth: Payer: Self-pay | Admitting: Family Medicine

## 2018-08-11 NOTE — Telephone Encounter (Signed)
Attempted to call patient with Spanish interpreter Mariel with follow-up appointment and 2 hr gtt. No answer, appointment was left on voicemail (5/5 @ 0815). Patient informed to coming fasting and to give the office a call if needing to reschedule.

## 2018-08-22 ENCOUNTER — Other Ambulatory Visit: Payer: Medicaid Other

## 2018-08-26 ENCOUNTER — Other Ambulatory Visit: Payer: Medicaid Other

## 2018-08-26 ENCOUNTER — Encounter: Payer: Medicaid Other | Admitting: Student

## 2018-08-26 ENCOUNTER — Other Ambulatory Visit: Payer: Self-pay

## 2018-08-26 DIAGNOSIS — Z3480 Encounter for supervision of other normal pregnancy, unspecified trimester: Secondary | ICD-10-CM

## 2018-08-27 LAB — GLUCOSE TOLERANCE, 2 HOURS W/ 1HR
Glucose, 1 hour: 152 mg/dL (ref 65–179)
Glucose, 2 hour: 90 mg/dL (ref 65–152)
Glucose, Fasting: 75 mg/dL (ref 65–91)

## 2018-08-27 LAB — HIV ANTIBODY (ROUTINE TESTING W REFLEX): HIV Screen 4th Generation wRfx: NONREACTIVE

## 2018-08-27 LAB — CBC
Hematocrit: 34.5 % (ref 34.0–46.6)
Hemoglobin: 12.2 g/dL (ref 11.1–15.9)
MCH: 32.4 pg (ref 26.6–33.0)
MCHC: 35.4 g/dL (ref 31.5–35.7)
MCV: 92 fL (ref 79–97)
Platelets: 173 10*3/uL (ref 150–450)
RBC: 3.76 x10E6/uL — ABNORMAL LOW (ref 3.77–5.28)
RDW: 12.2 % (ref 11.7–15.4)
WBC: 9.1 10*3/uL (ref 3.4–10.8)

## 2018-08-27 LAB — RPR: RPR Ser Ql: NONREACTIVE

## 2018-09-01 ENCOUNTER — Telehealth: Payer: Self-pay | Admitting: Obstetrics & Gynecology

## 2018-09-01 NOTE — Telephone Encounter (Signed)
Called the patient with the in office interrupter to inform of the upcoming visit tomorrow at [facility]. Left a detailed voicemail message.

## 2018-09-02 ENCOUNTER — Other Ambulatory Visit: Payer: Medicaid Other

## 2018-09-02 ENCOUNTER — Ambulatory Visit (INDEPENDENT_AMBULATORY_CARE_PROVIDER_SITE_OTHER): Payer: Medicaid Other | Admitting: Student

## 2018-09-02 ENCOUNTER — Other Ambulatory Visit: Payer: Self-pay

## 2018-09-02 VITALS — BP 110/73 | HR 99 | Temp 98.0°F | Wt 155.4 lb

## 2018-09-02 DIAGNOSIS — Z3A29 29 weeks gestation of pregnancy: Secondary | ICD-10-CM

## 2018-09-02 DIAGNOSIS — Z3480 Encounter for supervision of other normal pregnancy, unspecified trimester: Secondary | ICD-10-CM

## 2018-09-02 DIAGNOSIS — Z3483 Encounter for supervision of other normal pregnancy, third trimester: Secondary | ICD-10-CM

## 2018-09-02 NOTE — Patient Instructions (Signed)
La lactancia y el cuidado personal Breastfeeding and Self-Care Al comenzar a Museum/gallery exhibitions officer al nuevo beb, es normal que surjan algunos problemas. Sin embargo, hay ciertas cosas que puede hacer para cuidarse y ayudar a prevenir muchos problemas frecuentes. Por ejemplo, debe mantener sus mamas sanas y asegurarse de que el beb agarre bien el pezn con su boca (se prenda) cuando lo amamante. Consulte a su mdico o a Games developer (asesor en Market researcher) para Financial risk analyst qu es lo que mejor funciona en su caso. Siga estas indicaciones en su casa: Estrategia para la lactancia   Asegrese siempre de que el beb se prenda bien para Museum/gallery exhibitions officer.  Asegrese de que el beb est en una posicin adecuada. Pruebe diferentes posiciones para Copy una que funcione tanto para usted como para el beb.  Amamante al beb cuando muestre signos de hambre o si siente la necesidad de Chief Financial Officer. Esto se denomina "lactancia a demanda".  No retrase los horarios para Museum/gallery exhibitions officer.  Intente relajarse cuando sea la hora de alimentar al beb. De este [place], ayuda a que el cuerpo libere la [place] de sus [place].  Para ayudar a que aumente el flujo de [place]: ? Squese una pequea cantidad de [place] de las mamas justo antes de Museum/gallery exhibitions officer. Para hacerlo, use un sacaleches o use las manos. ? Aplquese calor hmedo en las mamas justo antes de [place]. Puede hacerlo en la ducha o con toallas de mano humedecidas con agua tibia. ? Hgase masajes en las mamas justo antes de amamantar o [place]. Cuidado de las mamas   Para ayudar a Pharmacologist sus mamas sanas y Automotive engineer que se resequen, haga lo siguiente: ? Evite usar [employer]. ? Seque al [employer] pezones durante 3 o despus de amamantar al beb. ? Utilice solo discos absorbentes de algodn en el sostn para Insurance account manager [employer] se filtre. Asegrese de [employer] si se empapan con [place]. Si [place] discos en el  sostn que se pueden Technical brewer, cmbielos con frecuencia. ? Pngase lanolina sobre los pezones luego de Museum/gallery exhibitions officer. Si [place] lanolina pura, no tiene que lavarse los pezones antes de alimentar al beb nuevamente. La lanolina pura no es perjudicial para el beb. ? Frtese los pezones con [place] materna: ? Saque con la mano algunas gotas de [place]. ? Masajee suavemente la [employer]. ? Deje secar los pezones al aire.  Use un sostn para amamantar. Evite usar: ? Ropa ajustada. ? Sostenes con aro o sostenes que [employer].  Aplquese hielo para Engineer, materials o la inflamacin de las mamas: ? Ponga el hielo en una bolsa plstica. ? Coloque una [employer] piel y la bolsa de hielo. ? Coloque el hielo durante , 2 a 3veces por da. Instrucciones generales  Beba suficiente lquido para mantener la orina de color amarillo plido.  Descanse lo suficiente. Duerma mientras el beb duerme.  Consulte al mdico o a un especialista en lactancia antes de tomar suplementos a base de hierbas. Comunquese con un mdico si:  Tiene dolor en los pezones.  Presenta agrietamiento o irritacin en los pezones durante ms de 1semana.  Tiene las mamas llenas de leche en exceso (congestin mamaria) durante ms de 48 horas.  Tiene fiebre.  Le sale un lquido parecido al pus del pezn.  Presenta enrojecimiento, una erupcin, hinchazn, picazn o ardor en las mamas.  Su beb no aumenta de peso.  Su beb pierde peso. Resumen  48 horas.   Tiene fiebre.   Le sale un lquido parecido al pus del pezn.   Presenta enrojecimiento, una erupcin, hinchazn, picazn o ardor en las mamas.   Su beb no aumenta de peso.   Su beb pierde peso.  Resumen   Hay ciertas cosas que puede hacer para cuidarse y ayudar a prevenir muchos problemas frecuentes de la lactancia.   Asegrese siempre de que el beb agarre bien el pezn con su boca (se prenda) cuando lo amamante.   Evite que sus pezones se resequen, beba mucho lquido y descanse lo suficiente.   Amamante al beb a demanda. No retrase los horarios para amamantar.  Esta informacin no tiene como fin reemplazar el consejo del mdico. Asegrese de  hacerle al mdico cualquier pregunta que tenga.  Document Released: 01/03/2017 Document Revised: 01/03/2017 Document Reviewed: 01/03/2017  Elsevier Interactive Patient Education  2019 Elsevier Inc.

## 2018-09-02 NOTE — Progress Notes (Signed)
    PRENATAL VISIT NOTE  Subjective:  Lisa Hodge is a 33 y.o. G2P1001 at [redacted]w[redacted]d being seen today for ongoing prenatal care.  She is currently monitored for the following issues for this low-risk pregnancy and has Supervision of other normal pregnancy, antepartum on their problem list.  Patient reports no complaints. She has been taking her BP at home but it has been very low so she did not record it. She denies any HA, blurry vision, RUQ pain.    Contractions: Not present. Vag. Bleeding: None.  Movement: Present. Denies leaking of fluid.   The following portions of the patient's history were reviewed and updated as appropriate: allergies, current medications, past family history, past medical history, past social history, past surgical history and problem list.   Objective:   Vitals:   09/02/18 0824  BP: 110/73  Pulse: 99  Temp: 98 F (36.7 C)  Weight: 155 lb 6.4 oz (70.5 kg)    Fetal Status: Fetal Heart Rate (bpm): 149 Fundal Height: 29 cm Movement: Present     General:  Alert, oriented and cooperative. Patient is in no acute distress.  Skin: Skin is warm and dry. No rash noted.   Cardiovascular: Normal heart rate noted  Respiratory: Normal respiratory effort, no problems with respiration noted  Abdomen: Soft, gravid, appropriate for gestational age.  Pain/Pressure: Absent     Pelvic: Cervical exam deferred        Extremities: Normal range of motion.  Edema: None  Mental Status: Normal mood and affect. Normal behavior. Normal judgment and thought content.   Assessment and Plan:  Pregnancy: G2P1001 at [redacted]w[redacted]d 1. Supervision of other normal pregnancy, antepartum -Not all prenatal labs were drawn, so will re-draw the rest of them today -Interval growth [place] scheduled -Reviewed GTT results (passed) -answered questions about Covid and risk reduction; patient has not been going outside at all for walks. I encouraged her to do so for her mental health and decrease anxiety  -Encouraged patient to place the cuff higher on her arm and recheck BP; if she has it correctly placed and is feeling fine, then she should record her BP as is.  - Hepatitis B surface antigen - Rubella screen - [place] MFM OB FOLLOW UP; Future - ABO/Rh; Future - ABO/Rh  Preterm labor symptoms and general obstetric precautions including but not limited to vaginal bleeding, contractions, leaking of fluid and fetal movement were reviewed in detail with the patient. Please refer to After Visit Summary for other counseling recommendations.   Return in about 2 weeks (around 09/16/2018), or LROB with KK can be virtual.  Future Appointments  Date Time Provider Department Center  09/10/2018  8:30 AM WH-MFC [place] 1 WH-MFCUS MFC-US  09/16/2018  4:15 PM Crisoforo [place], Charlesetta Garibaldi, CNM WOC-WOCA WOC    Charlesetta Garibaldi [place], [place]

## 2018-09-03 LAB — ABO/RH: Rh Factor: POSITIVE

## 2018-09-03 LAB — RUBELLA SCREEN: Rubella Antibodies, IGG: 11.8 index (ref >0.99)

## 2018-09-03 LAB — HEPATITIS B SURFACE ANTIGEN: Hepatitis B Surface Ag: NEGATIVE

## 2018-09-10 ENCOUNTER — Ambulatory Visit (HOSPITAL_COMMUNITY): Payer: Medicaid Other | Attending: Obstetrics and Gynecology

## 2018-09-12 ENCOUNTER — Telehealth: Payer: Self-pay | Admitting: Obstetrics & Gynecology

## 2018-09-12 NOTE — Telephone Encounter (Signed)
Called the patient to confirm the appointment at our [place] location with the in office interrupter. Left a detailed voicemail. °

## 2018-09-16 ENCOUNTER — Ambulatory Visit (INDEPENDENT_AMBULATORY_CARE_PROVIDER_SITE_OTHER): Payer: Medicaid Other | Admitting: Student

## 2018-09-16 ENCOUNTER — Other Ambulatory Visit: Payer: Self-pay

## 2018-09-16 DIAGNOSIS — Z3480 Encounter for supervision of other normal pregnancy, unspecified trimester: Secondary | ICD-10-CM

## 2018-09-16 DIAGNOSIS — Z3483 Encounter for supervision of other normal pregnancy, third trimester: Secondary | ICD-10-CM | POA: Diagnosis not present

## 2018-09-16 DIAGNOSIS — Z3A31 31 weeks gestation of pregnancy: Secondary | ICD-10-CM

## 2018-09-16 NOTE — Progress Notes (Signed)
Patient ID: Lisa Hodge, female   DOB: 1985-09-27, 33 y.o.   MRN: [id number]   TELEHEALTH VIRTUAL OBSTETRICS VISIT ENCOUNTER NOTE  I connected with Lisa Hodge on 09/17/18 at  4:15 PM EDT by telephone at home and verified that I am speaking with the correct person using two identifiers.   I discussed the limitations, risks, security and privacy concerns of performing an evaluation and management service by telephone and the availability of in person appointments. I also discussed with the patient that there may be a patient responsible charge related to this service. The patient expressed understanding and agreed to proceed.  Subjective:  Lisa Hodge is a 33 y.o. G2P1001 at [redacted]w[redacted]d being followed for ongoing prenatal care.  She is currently monitored for the following issues for this low-risk pregnancy and has Supervision of other normal pregnancy, antepartum on their problem list.  Patient reports no complaints. She has been using her blood pressure cuff at home but has not been reporting it because she thought the readings were too low. Reports fetal movement. Denies any contractions, bleeding or leaking of fluid.   The following portions of the patient's history were reviewed and updated as appropriate: allergies, current medications, past family history, past medical history, past social history, past surgical history and problem list.   Objective:   General:  Alert, oriented and cooperative.   Mental Status: Normal mood and affect perceived. Normal judgment and thought content.  Rest of physical exam deferred due to type of encounter  Assessment and Plan:  Pregnancy: G2P1001 at [redacted]w[redacted]d 1. Supervision of other normal pregnancy, antepartum -Doing well; wants to come back on 10-28-2018 for in person visit. Strongly desires to wait until 37 weeks for in-person visit.  -Follow up US this Friday, 09-19-2018 -Will log BPs going forward; patient knows how to use  cuff and her BPs are consistent with last pregnancy so unlikely that her low BP readings are incorrect. Reassuring that she is not developing pre-eclampsia or gestational hypertension.   Preterm labor symptoms and general obstetric precautions including but not limited to vaginal bleeding, contractions, leaking of fluid and fetal movement were reviewed in detail with the patient.  I discussed the assessment and treatment plan with the patient. The patient was provided an opportunity to ask questions and all were answered. The patient agreed with the plan and demonstrated an understanding of the instructions. The patient was advised to call back or seek an in-person office evaluation/go to [facility] at [facility] for any urgent or concerning symptoms. Please refer to After Visit Summary for other counseling recommendations.   I provided 20 minutes of non-face-to-face time during this encounter.  Return in about 6 weeks (around 10/28/2018), or in person with KK.  Future Appointments  Date Time Provider Department Center  09/19/2018  3:45 PM WH-MFC [place] 2 WH-MFCUS MFC-US  10/28/2018  8:55 AM , Charlesetta Garibaldi, CNM WOC-WOCA WOC    Marylene Land, CNM Center for [employer], [facility]

## 2018-09-16 NOTE — Progress Notes (Signed)
1605::Interpreter #[id number] used to call pt regarding her appointment.  Pt did not pick up.  Left message advising pt that she was being contacted regarding her appointment and requesting she contact the clinic.  Will attempt again in 15 minutes.  1617::[name] used to call pt 2nd attempt for visit.  I connected with  Lisa Hodge on 09/16/18 at  4:15 PM EDT by telephone and verified that I am speaking with the correct person using two identifiers.   I discussed the limitations, risks, security and privacy concerns of performing an evaluation and management service by telephone and the availability of in person appointments. I also discussed with the patient that there may be a patient responsible charge related to this service. The patient expressed understanding and agreed to proceed.  Osvaldo Human, RN 09/16/2018  4:22 PM

## 2018-09-19 ENCOUNTER — Ambulatory Visit (HOSPITAL_COMMUNITY)
Admission: RE | Admit: 2018-09-19 | Discharge: 2018-09-19 | Disposition: A | Discharge status: Home / Self Care | Payer: Medicaid Other | Source: Ambulatory Visit | Attending: Obstetrics and Gynecology | Admitting: Obstetrics and Gynecology

## 2018-09-19 ENCOUNTER — Other Ambulatory Visit: Payer: Self-pay

## 2018-09-19 DIAGNOSIS — O0933 Supervision of pregnancy with insufficient antenatal care, third trimester: Secondary | ICD-10-CM | POA: Diagnosis not present

## 2018-09-19 DIAGNOSIS — Z3A31 31 weeks gestation of pregnancy: Secondary | ICD-10-CM | POA: Diagnosis not present

## 2018-09-19 DIAGNOSIS — O09893 Supervision of other high risk pregnancies, third trimester: Secondary | ICD-10-CM | POA: Diagnosis not present

## 2018-09-19 DIAGNOSIS — Z3480 Encounter for supervision of other normal pregnancy, unspecified trimester: Secondary | ICD-10-CM | POA: Diagnosis not present

## 2018-09-19 DIAGNOSIS — Z362 Encounter for other antenatal screening follow-up: Secondary | ICD-10-CM | POA: Diagnosis not present

## 2018-10-27 ENCOUNTER — Telehealth: Payer: Self-pay | Admitting: Obstetrics & Gynecology

## 2018-10-27 NOTE — Telephone Encounter (Signed)
Called the patient to pre-screen. Left a detailed voicemail informing if the patient is experiencing any flu-like symptoms such as fever, chills, cough, or shortness of breath and/or has been in contact with anyone who is suspected of/or confirmed to have COVID19 please call back to reschedule the appointment. Upon entering the office please wear a face mask, sanitize hands, and no visitors or children due to [place] restrictions.  Called with in office interrupter [place]

## 2018-10-28 ENCOUNTER — Ambulatory Visit (INDEPENDENT_AMBULATORY_CARE_PROVIDER_SITE_OTHER): Payer: Medicaid Other | Admitting: Student

## 2018-10-28 ENCOUNTER — Other Ambulatory Visit: Payer: Self-pay

## 2018-10-28 ENCOUNTER — Other Ambulatory Visit (HOSPITAL_COMMUNITY)
Admission: RE | Admit: 2018-10-28 | Discharge: 2018-10-28 | Disposition: A | Discharge status: Home / Self Care | Payer: Medicaid Other | Source: Ambulatory Visit | Attending: Student | Admitting: Student

## 2018-10-28 VITALS — BP 102/72 | HR 111 | Temp 97.8°F | Wt 160.6 lb

## 2018-10-28 DIAGNOSIS — Z3480 Encounter for supervision of other normal pregnancy, unspecified trimester: Secondary | ICD-10-CM | POA: Diagnosis not present

## 2018-10-28 DIAGNOSIS — Z3A37 37 weeks gestation of pregnancy: Secondary | ICD-10-CM

## 2018-10-28 DIAGNOSIS — Z3483 Encounter for supervision of other normal pregnancy, third trimester: Secondary | ICD-10-CM

## 2018-10-28 LAB — OB RESULTS CONSOLE GC/CHLAMYDIA: Gonorrhea: NEGATIVE

## 2018-10-28 LAB — OB RESULTS CONSOLE GBS: GBS: POSITIVE

## 2018-10-28 NOTE — Progress Notes (Signed)
   PRENATAL VISIT NOTE  Subjective:  Lisa Hodge is a 33 y.o. G2P1001 at [redacted]w[redacted]d being seen today for ongoing prenatal care.  She is currently monitored for the following issues for this low-risk pregnancy and has Supervision of other normal pregnancy, antepartum on their problem list.  Patient reports no complaints.  Contractions: Not present. Vag. Bleeding: None.  Movement: Present. Denies leaking of fluid.   The following portions of the patient's history were reviewed and updated as appropriate: allergies, current medications, past family history, past medical history, past social history, past surgical history and problem list.   Objective:   Vitals:   10/28/18 0922  BP: 102/72  Pulse: (!) 111  Temp: 97.8 F (36.6 C)  Weight: 160 lb 9.6 oz (72.8 kg)    Fetal Status: Fetal Heart Rate (bpm): 157 Fundal Height: 37 cm Movement: Present  Presentation: Vertex  General:  Alert, oriented and cooperative. Patient is in no acute distress.  Skin: Skin is warm and dry. No rash noted.   Cardiovascular: Normal heart rate noted  Respiratory: Normal respiratory effort, no problems with respiration noted  Abdomen: Soft, gravid, appropriate for gestational age.  Pain/Pressure: Present     Pelvic: Cervical exam performed Dilation: Closed Effacement (%): Thick Station: Ballotable  Extremities: Normal range of motion.  Edema: None  Mental Status: Normal mood and affect. Normal behavior. Normal judgment and thought content.   Assessment and Plan:  Pregnancy: G2P1001 at [redacted]w[redacted]d 1. Supervision of other normal pregnancy, antepartum -Reviewed BabyRx BPs, all normal.  -Vertex by bedside US -cultures today -reassurance and guidance on next steps of pregnancy and signs of labor, as well as how to get covid test as patient is very anxious about developing covid.  -Ask about peds at next visit - GC/Chlamydia probe amp ([place])not at [facility] - Culture, beta strep (group b only)  Term labor  symptoms and general obstetric precautions including but not limited to vaginal bleeding, contractions, leaking of fluid and fetal movement were reviewed in detail with the patient. Please refer to After Visit Summary for other counseling recommendations.   Return in about 1 week (around 11/04/2018), or LROB next week with Rasch on 14, then on 21st with KK, then with any APP after that weekly.  Future Appointments  Date Time Provider [place]  11/04/2018  1:35 PM Rasch, Artist Pais, NP WOC-WOCA WOC  11/11/2018 10:35 AM [place], CNM WOC-WOCA [place]  11/17/2018  1:15 PM WOC-WOCA NST WOC-WOCA WOC  11/17/2018  2:15 PM [place] Mall, [place]    [place], [place]

## 2018-10-28 NOTE — Patient Instructions (Signed)
AREA PEDIATRIC/FAMILY PRACTICE PHYSICIANS  [place] ([address]) . [facility] o Chambliss, MD; Eniola, MD; Hale, MD; Hensel, MD; McDiarmid, MD; McIntyer, MD; Neal, MD; Walden, MD o [address] [place], [place] [address] o [phone] o Mon-Fri 8:30-12:30, 1:30-5:00 o Providers come to see babies at [facility] o Accepting Medicaid . [facility] o Limited providers who accept newborns: Koirala, MD; Morrow, MD; Wolters, MD o [address], [place], [place] [address] o [phone] o Mon-Fri 8:00-5:30 o Babies seen by providers at [facility] o Does NOT accept Medicaid o Please call early in hospitalization for appointment (limited availability)  . [facility] o Mulberry, MD o [address] [place], [place] [address] o [phone] o Mon, Tue, Thur, Fri 8:30-5:00, Wed 10:00-7:00 (closed 1-2pm) o Babies seen by [facility] providers o Accepting Medicaid . [facility] - Pediatrician o Rubin, MD o [address], [place], [place] [address] o [phone] o Mon-Fri 8:30-5:00, Sat 8:30-12:00 o Provider comes to see babies at [facility] o Accepting Medicaid o Must have been referred from current patients or contacted office prior to delivery . [facility] ([facility]) o Brown, MD; Chandler, MD; Ettefagh, MD; Grant, MD; Lester, MD; McCormick, MD; McQueen, MD; Prose, MD; Simha, MD; Stanley, MD; Stryffeler, NP; Tebben, NP o [address], [place], [place] [address] o [phone] o Mon, Tue, Thur, Fri 8:30-5:30, Wed 9:30-5:30, Sat 8:30-12:30 o Babies seen by [facility] providers o Accepting Medicaid o Only accepting infants of first-time parents or siblings of current patients o Hospital discharge coordinator will make follow-up appointment . Jack Amos o [address],  [place], [place]  [address] o [phone]   Fax - [phone] . [facility] o [address], [place], [place]  [address] o Phone - [phone]   Fax - [phone] . Shilpa Gosrani o [address], [place], [place]  [address] o [phone]  [place] ([address]) . [facility] o Bates, MD; Brassfield, MD; Cooper, Cox, MD; MD; Davis, MD; Dovico, MD; Ettefaugh, MD; Little, MD; Lowe, MD; Keiffer, MD; Melvin, MD; Sumner, MD; Williams, MD o [address], [place], [place] [address] o [phone] o Mon-Fri 8:30-5:00 (extended evenings Mon-Thur as needed), Sat-Sun 10:00-1:00 o Providers come to see babies at [facility] o Accepting Medicaid for families of first-time babies and families with all children in the household age 3 and under. Must register with office prior to making appointment (M-F only). . [facility] o Henson, NP; Knapp, MD; Lalonde, MD; Tysinger, PA o [address] [place], [place] [address] o [phone] o Mon-Fri 8:00-5:00 o Babies seen by providers at [facility] o Does NOT accept Medicaid/Commercial Insurance Only . [facility] ([facility])  o Artis, MD; Barnes, MD; Bratton, MD; Coccaro, MD; [place], MD; Kramer, MD; Marshall, MD; Netherton, MD; Poleto, MD; Skinner, MD o [address] [place], [place] [address] o [phone] o Mon-Fri 8:30-5:30, Sat (Oct.-Mar.) 9:00-1:00 o Babies seen by providers at [facility] o Accepting Medicaid  [place] ([address]) . [facility] o Reid, MD; Warner, MD o [address], [place], [place] [address] o [phone] o Mon-Fri 8:30-5:00, Sat 8:30-12:00 o Providers come to see babies at [facility] o Does NOT accept Medicaid . [facility] o Becker, PA; Hagler, MD; Scifres, PA; Sun, MD; Swayne, MD o [address],  [place], [place] [address] o [phone] o Mon-Fri 8:00-5:00 o Babies seen by providers at [facility] o Does NOT accept Medicaid o Only accepting babies of parents who are patients o Please call early in hospitalization for appointment (limited availability) . [facility] o Clark, MD; Frye, MD; Kelleher, MD; Mack, NP; Miller, MD; O'Keller, MD; Patterson, NP; Pudlo, MD; Puzio, MD; Thomas, MD; Tucker, MD; Twiselton, MD o [address], [place], [place] [address] o [phone] o Mon-Fri 8:00-5:00, Sat 9:00-12:00 o Providers come to see babies at [facility] o Does NOT accept Medicaid  [place] ([address]) . [facility] o Limited providers accepting new patients: Brake, NP; Wharton, PA o [address], [place], [place] [address] o [phone] o Mon-Fri 8:00-5:00 o Babies seen by providers at [facility] o Does NOT accept Medicaid o Only accepting babies of parents who are patients o Please call early in hospitalization for appointment (limited availability) . [facility] o Gay, MD; Quinlan, MD o [address] [place], [place] [address] o [phone] (press 1 to schedule appointment) o Mon-Fri 8:00-5:00 o Providers come to see babies at [facility] o Does NOT accept Medicaid . [facility] o Mazer, MD o [address] [place], [place] [address] o [phone] o Mon-Fri 8:30-5:00 (lunch 12:30-1:00), extended hours by appointment only Wed 5:00-6:30 o Babies seen by [facility] providers o Accepting Medicaid . [facility] o Banks, MD; Jordan, MD; Koberlein, MD o [address], [place], [place] [address] o [phone] o Mon-Fri 8:00-5:00 o Babies seen by [facility] providers o Does NOT accept Medicaid . [facility] o Parker, MD; Hunter, MD; Wallace, DO o [address] [place], [place]  [address] o [phone] o Mon-Fri 8:00-5:00 o Babies seen by [facility] providers o Does NOT accept Medicaid . [facility] o Brandon, PA; Brecken, PA; Christy, NP; Dees, MD; DeClaire, MD; DeWeese, MD; Hansen, NP; Mills, NP; Parrish, NP; Smoot, NP; Summer, MD; Vapne, MD o [address] [place], [place] [address] o [phone] o Mon-Fri 8:30-5:00, Sat 10:00-1:00 o Providers come to see babies at [facility] o Does NOT accept Medicaid o Free prenatal information session Tuesdays at 4:45pm . [facility] o Bouska, MD; Gordon, PA; Jeffery, PA; Weber, PA o [address] [place] [address] o [phone] o Mon-Fri 7:30-5:30 o Babies seen by [facility] providers . [facility] o [address], [place], [place]  [address] o [phone]   Fax - [phone]  [place] ([address] & [address]) . [facility] o [place]ese, [place] o [address] [place], [place] [address] o [phone] o Mon-Thur 8:00-6:00 o Providers come to see babies at [facility] o Accepting Medicaid . [facility] o Anderson, NP; Badger, MD; Beal, PA; Spencer, PA o [address] [place], [place] [address] o [phone] o Mon-Thur 7:30-7:30, Fri 7:30-4:30 o Babies seen by [facility] providers o Accepting Medicaid . [facility] o Agbuya, MD; Klett, NP; Romgoolam, MD o [address], [place], [place] [address] o [phone] o Mon-Fri 8:30-5:00, Sat 8:30-12:00 o Providers come to see babies at [facility] o Accepting Medicaid o Must have "Meet & Greet" appointment at office prior to delivery . [facility] o McCord,   MD; Wallace, MD; Wood, MD o [address], [place], [place] [address] o [phone] o Mon-Wed 8:00-6:00, Thur-Fri 8:00-5:00, Sat 9:00-12:00 o Providers come to  see babies at [facility] o Does NOT accept Medicaid o Only accepting siblings of current patients . [facility]  o [address], [place], [place]  [address] o [phone]   Fax - [phone] . [facility] o [address], [place], [place]  [address] o [phone]   Fax - [phone]  [place] ([address]) . [facility] o Cirigliano, DO; Matthews, DO o [address] [place], [place] [address] o [phone] o Mon-Fri 7:00-5:00 o Babies seen by [facility] providers o Does NOT accept Medicaid . [facility] o Briscoe, MD; Howley, PA; Moreira, PA o [address], [place], [place] [address] o [phone] o Mon-Fri 8:00-5:00 o Babies seen by [facility] providers o Accepting Medicaid . [facility] o Boyd, MD; Church, PA; Jones, NP; Osborn, PA o [address], [place], [place] [address] o [phone] o Mon-Fri 8:00-5:00 o Babies seen by providers at [facility] o Accepting Medicaid  [place] ([address]) . [facility] [place] o Wendling, DO o [address] [place], [place] [address] o [phone] o Mon-Fri 8:00-5:00 o Babies seen by [facility] providers o Does NOT accept Medicaid o Limited availability, please call early in hospitalization to schedule follow-up . [facility] o Calderon, PA; Cummings, MD; Dillard, MD; Martin, PA; Olson, MD; VanDeven, PA o [address], [place], [place] [address] o [phone] o Mon-Fri 8:30-5:00, Sat 9:00-12:00 o Babies seen by providers at [facility] o Accepting Medicaid o Please register online then schedule online or call office o [email or web address] . [facility] ([facility] o Hunter, NP; Kumar, MD; Martin Rogers, PA o [address], [place], [place] [address] o [phone] o Mon-Fri 8:00-5:00 o Babies seen by providers at [facility] o Accepting Medicaid . [facility] ([facility]) o , MD; Kristi Fleenor, NP; West, MD o [address], [place], [place] [address] o [phone] o Mon-Fri 8:00-5:30, Sat&Sun by appointment (phones open at 8:30) o Babies seen by [facility] providers o Accepting Medicaid o Must be a first-time baby or sibling of current patient . [facility] [place]  o [address], [place], [place]  [address] o [phone]   Fax - [phone]  [place] ([address]) . [facility] o Brown, PA; Cowen, PA; Rice, MD; Helton, PA; Spry, MD o [address] [place], [place] [address] o [phone] o Mon-Thur 8:00-7:00, Fri 8:00-5:00, Sat 8:00-12:00, Sun 9:00-12:00 o Babies seen by [facility] providers o Accepting Medicaid . [facility] o Coe-Goins, MD; Marshall, MD; Pierre-Louis, MD o [address] [place] [address], [place], [place] [address] o [phone] o Mon-Thur 8:00-5:00 o Babies seen by providers at [facility] o Accepting Medicaid . [facility] o Bratton, MD; Coe-Goins, MD; Hayes, MD; Lewis, MD; List, MD; Lott, MD; Marshall, MD; Moran, MD; O'Neal, MD; Pierre-Louis, MD; Pitonzo, MD; Scholer, MD; Spangle, MD o [address] [place], [place]   [address] o [phone] o Mon-Fri 8:00-5:30, Sat (Oct.-Mar.) 9:00-1:00 o Babies seen by providers at [facility] o Accepting Medicaid o Must fill out new patient packet, available online at [email or web address] . [facility] ([facility] o Friddle, NP; Harris, NP; Kelly, NP; Logan, MD;  Melvin, PA; Poth, MD; Ramadoss, MD; Stanton, NP o [address], [place], [place] [address] o [phone] o Mon-Thur 8:00-5:30, Fri 8:00-5:00 o Babies seen by providers at [facility] o Accepting Medicaid  [place] ([address]) . [facility] o Dixon, PA; Fircrest, MD; Pickard, MD; Tapia, PA o [address], [place], [place] [address] o [phone] o Mon-Fri 8:00-5:00 o Babies seen by providers at [facility] o Accepting Medicaid   [place] ([address]) . [facility] [place] o Masneri, DO; Meyers, MD; Nelson, PA o [address], [place], [place] [address] o [phone] o Mon-Fri 8:00-5:00 o Babies seen by providers at [facility] o Does NOT accept Medicaid o Limited appointment availability, please call early in hospitalization  . [facility] [place] o Kunedd, DO; McGowen, MD o [address], [place], [place] [address] o [phone] o Mon-Fri 8:00-5:00 o Babies seen by [facility] providers o Does NOT accept Medicaid . [facility] [place] o Cameron, MD; MacDonald, MD; Michaels, PA; Nayak, MD o [address] [place] [address], [place], [place] [address] o [phone] o Mon-Fri 8:00-5:00 o After hours clinic ([address] [place], [place] [address]) [phone] Mon-Fri 5:00-8:00, Sat 12:00-6:00, Sun 10:00-4:00 o Babies seen by [facility] providers o Accepting Medicaid . [facility] [place] o [address], [place], [place]  [address] o [phone]   Fax - [phone]  [place] ([address]) . [facility] [place] [facility] o Andy, MD o [address], [place], [place] [address] o [phone] o Mon-Fri 8:00-5:00 o Babies seen by [facility] providers o Does NOT accept Medicaid . [facility] [place] ([facility] [place]) o Eksir, MD o [address], [place],   [address] o [phone] o Mon-Thur 8:00-7:00, Fri 8:00-5:00, Sat 8:00-12:00 o Babies seen by providers at [facility] o Accepting Medicaid - but does not have vaccinations in office (must be received elsewhere) o Limited availability, please call early in hospitalization  [place] ([address]) . [facility]  o Charlene Flemming, MD o [address], [place]  [address] o [phone]  Fax [phone]   

## 2018-10-29 LAB — GC/CHLAMYDIA PROBE AMP (~~LOC~~) NOT AT ARMC
Chlamydia: NEGATIVE
Neisseria Gonorrhea: NEGATIVE

## 2018-11-01 ENCOUNTER — Encounter: Payer: Self-pay | Admitting: Student

## 2018-11-01 DIAGNOSIS — R8271 Bacteriuria: Secondary | ICD-10-CM | POA: Insufficient documentation

## 2018-11-01 LAB — CULTURE, BETA STREP (GROUP B ONLY): Strep Gp B Culture: POSITIVE — AB

## 2018-11-04 ENCOUNTER — Other Ambulatory Visit: Payer: Self-pay

## 2018-11-04 ENCOUNTER — Ambulatory Visit (INDEPENDENT_AMBULATORY_CARE_PROVIDER_SITE_OTHER): Payer: Medicaid Other | Admitting: Obstetrics and Gynecology

## 2018-11-04 VITALS — BP 94/67 | HR 82

## 2018-11-04 DIAGNOSIS — Z3A38 38 weeks gestation of pregnancy: Secondary | ICD-10-CM | POA: Diagnosis not present

## 2018-11-04 DIAGNOSIS — O98813 Other maternal infectious and parasitic diseases complicating pregnancy, third trimester: Secondary | ICD-10-CM | POA: Diagnosis not present

## 2018-11-04 DIAGNOSIS — B951 Streptococcus, group B, as the cause of diseases classified elsewhere: Secondary | ICD-10-CM

## 2018-11-04 DIAGNOSIS — Z3480 Encounter for supervision of other normal pregnancy, unspecified trimester: Secondary | ICD-10-CM

## 2018-11-04 DIAGNOSIS — R8271 Bacteriuria: Secondary | ICD-10-CM

## 2018-11-04 NOTE — Progress Notes (Signed)
   TELEHEALTH VIRTUAL OBSTETRICS VISIT ENCOUNTER NOTE  I connected with Lisa Hodge on 11/04/18 at  1:35 PM EDT by telephone at home and verified that I am speaking with the correct person using two identifiers.   I discussed the limitations, risks, security and privacy concerns of performing an evaluation and management service by telephone and the availability of in person appointments. I also discussed with the patient that there may be a patient responsible charge related to this service. The patient expressed understanding and agreed to proceed.  Subjective:  Lisa Hodge is a 33 y.o. G2P1001 at [redacted]w[redacted]d being followed for ongoing prenatal care.  She is currently monitored for the following issues for this low-risk pregnancy and has Supervision of other normal pregnancy, antepartum and Group B streptococcal bacteriuria on their problem list.  Patient reports no complaints. Reports fetal movement. Denies any contractions, bleeding or leaking of fluid.   The following portions of the patient's history were reviewed and updated as appropriate: allergies, current medications, past family history, past medical history, past social history, past surgical history and problem list.   Objective:   General:  Alert, oriented and cooperative.   Mental Status: Normal mood and affect perceived. Normal judgment and thought content.  Rest of physical exam deferred due to type of encounter  Assessment and Plan:  Pregnancy: G2P1001 at [redacted]w[redacted]d  1. Supervision of other normal pregnancy, antepartum  Due date reviewed with patient: she is certain of LMP. She had an US done at 8 weeks transvaginal in [place] and was never given record of this. Based on her 8 week [place] they gave her a due date of 07/14/4008, her certain LMP gives her a due date of 2/72/5366  Due to certain LMP, will keep dating based on certain LMP BP 44/03 today.   2. Group B streptococcal bacteriuria  There are no  diagnoses linked to this encounter. Term labor symptoms and general obstetric precautions including but not limited to vaginal bleeding, contractions, leaking of fluid and fetal movement were reviewed in detail with the patient.  I discussed the assessment and treatment plan with the patient. The patient was provided an opportunity to ask questions and all were answered. The patient agreed with the plan and demonstrated an understanding of the instructions. The patient was advised to call back or seek an in-person office evaluation/go to [facility]U at [facility] for any urgent or concerning symptoms. Please refer to After Visit Summary for other counseling recommendations.   I provided 13 minutes of non-face-to-face time during this encounter.  No follow-ups on file.  Future Appointments  Date Time Provider [place]  11/11/2018 10:35 AM Starr Lake, CNM [facility] [place]  11/17/2018  1:15 PM [facility]  11/17/2018  2:15 PM Tresea Mall, [place], NP Center for [employer], [place]

## 2018-11-11 ENCOUNTER — Encounter: Payer: Medicaid Other | Admitting: Student

## 2018-11-11 ENCOUNTER — Telehealth: Payer: Self-pay | Admitting: Obstetrics & Gynecology

## 2018-11-11 NOTE — Telephone Encounter (Signed)
Spoke to patient w/ Spanish interpreter Mariel about her appointment on 7/22 @ 3:15. Patient instructed to wear a face mask for the entire appointment and no visitors are allowed with her during the visit. Patient screened for covid symptoms and denied having any

## 2018-11-12 ENCOUNTER — Ambulatory Visit: Payer: Self-pay

## 2018-11-12 ENCOUNTER — Ambulatory Visit (INDEPENDENT_AMBULATORY_CARE_PROVIDER_SITE_OTHER): Payer: Medicaid Other | Admitting: Obstetrics & Gynecology

## 2018-11-12 ENCOUNTER — Ambulatory Visit (INDEPENDENT_AMBULATORY_CARE_PROVIDER_SITE_OTHER): Payer: Medicaid Other | Admitting: *Deleted

## 2018-11-12 ENCOUNTER — Other Ambulatory Visit: Payer: Self-pay

## 2018-11-12 VITALS — BP 113/81 | HR 104 | Wt 161.9 lb

## 2018-11-12 DIAGNOSIS — Z3A40 40 weeks gestation of pregnancy: Secondary | ICD-10-CM

## 2018-11-12 DIAGNOSIS — R8271 Bacteriuria: Secondary | ICD-10-CM

## 2018-11-12 DIAGNOSIS — O480 Post-term pregnancy: Secondary | ICD-10-CM | POA: Diagnosis not present

## 2018-11-12 DIAGNOSIS — Z3480 Encounter for supervision of other normal pregnancy, unspecified trimester: Secondary | ICD-10-CM

## 2018-11-12 NOTE — Progress Notes (Signed)
   PRENATAL VISIT NOTE  Subjective:  Lisa Hodge is a 33 y.o. G2P1001 at [redacted]w[redacted]d being seen today for ongoing prenatal care.  She is currently monitored for the following issues for this low-risk pregnancy and has Supervision of other normal pregnancy, antepartum and Group B streptococcal bacteriuria on their problem list.  Patient reports no complaints.  Contractions: Not present. Vag. Bleeding: None.  Movement: Present. Denies leaking of fluid.   The following portions of the patient's history were reviewed and updated as appropriate: allergies, current medications, past family history, past medical history, past social history, past surgical history and problem list.   Objective:   Vitals:   11/12/18 1440  BP: 113/81  Pulse: (!) 104  Weight: 161 lb 14.4 oz (73.4 kg)    Fetal Status: Fetal Heart Rate (bpm): NST   Movement: Present     General:  Alert, oriented and cooperative. Patient is in no acute distress.  Skin: Skin is warm and dry. No rash noted.   Cardiovascular: Normal heart rate noted  Respiratory: Normal respiratory effort, no problems with respiration noted  Abdomen: Soft, gravid, appropriate for gestational age.  Pain/Pressure: Present     Pelvic: Cervical exam performed        Extremities: Normal range of motion.  Edema: None  Mental Status: Normal mood and affect. Normal behavior. Normal judgment and thought content.   Assessment and Plan:  Pregnancy: G2P1001 at [redacted]w[redacted]d 1. Supervision of other normal pregnancy, antepartum   2. Group B streptococcal bacteriuria - treat in labor  3. Post term pregnancy, antepartum condition or complication - IOL at 41 weeks  Term labor symptoms and general obstetric precautions including but not limited to vaginal bleeding, contractions, leaking of fluid and fetal movement were reviewed in detail with the patient. Please refer to After Visit Summary for other counseling recommendations.   Return in about 5 weeks  (around 12/17/2018) for postpartum visit.  No future appointments.  Emily Filbert, MD

## 2018-11-12 NOTE — Progress Notes (Signed)
Interpreter Raquel Mora present for encounter.  Pt informed that the ultrasound is considered a limited OB ultrasound and is not intended to be a complete ultrasound exam.  Patient also informed that the ultrasound is not being completed with the intent of assessing for fetal or placental anomalies or any pelvic abnormalities.  Explained that the purpose of today's ultrasound is to assess for presentation, BPP and amniotic fluid volume.  Patient acknowledges the purpose of the exam and the limitations of the study.    

## 2018-11-13 ENCOUNTER — Telehealth (HOSPITAL_COMMUNITY): Payer: Self-pay | Admitting: *Deleted

## 2018-11-13 ENCOUNTER — Encounter (HOSPITAL_COMMUNITY): Payer: Self-pay | Admitting: *Deleted

## 2018-11-13 NOTE — Telephone Encounter (Signed)
[phone] interpreter number  Preadmission screen

## 2018-11-13 NOTE — Telephone Encounter (Signed)
Preadmission screen Interpreter number [phone]

## 2018-11-14 ENCOUNTER — Other Ambulatory Visit: Payer: Self-pay

## 2018-11-14 ENCOUNTER — Other Ambulatory Visit (HOSPITAL_COMMUNITY)
Admission: RE | Admit: 2018-11-14 | Discharge: 2018-11-14 | Disposition: A | Discharge status: Home / Self Care | Payer: Medicaid Other | Source: Ambulatory Visit | Attending: Obstetrics & Gynecology | Admitting: Obstetrics & Gynecology

## 2018-11-14 DIAGNOSIS — Z1159 Encounter for screening for other viral diseases: Secondary | ICD-10-CM | POA: Diagnosis not present

## 2018-11-14 NOTE — MAU Note (Signed)
Asymptomatic, swab collected. Interpretor here to explain.

## 2018-11-15 LAB — SARS CORONAVIRUS 2 (TAT 6-24 HRS): SARS Coronavirus 2: NEGATIVE

## 2018-11-16 ENCOUNTER — Encounter (HOSPITAL_COMMUNITY): Payer: Self-pay | Admitting: *Deleted

## 2018-11-16 ENCOUNTER — Inpatient Hospital Stay (HOSPITAL_COMMUNITY): Payer: Medicaid Other

## 2018-11-16 ENCOUNTER — Inpatient Hospital Stay (HOSPITAL_COMMUNITY): Payer: Medicaid Other | Admitting: Anesthesiology

## 2018-11-16 ENCOUNTER — Inpatient Hospital Stay (HOSPITAL_COMMUNITY)
Admission: AD | Admit: 2018-11-16 | Discharge: 2018-11-18 | DRG: 806 | Disposition: A | Discharge status: Home / Self Care | Payer: Medicaid Other | Attending: Obstetrics & Gynecology | Admitting: Obstetrics & Gynecology

## 2018-11-16 DIAGNOSIS — O99824 Streptococcus B carrier state complicating childbirth: Secondary | ICD-10-CM | POA: Diagnosis present

## 2018-11-16 DIAGNOSIS — Z3A40 40 weeks gestation of pregnancy: Secondary | ICD-10-CM | POA: Diagnosis not present

## 2018-11-16 DIAGNOSIS — Z3480 Encounter for supervision of other normal pregnancy, unspecified trimester: Secondary | ICD-10-CM

## 2018-11-16 DIAGNOSIS — O480 Post-term pregnancy: Secondary | ICD-10-CM | POA: Diagnosis not present

## 2018-11-16 LAB — CBC
HCT: 39 % (ref 36.0–46.0)
HCT: 35.8 % — ABNORMAL LOW (ref 36.0–46.0)
Hemoglobin: 12.9 g/dL (ref 12.0–15.0)
Hemoglobin: 11.9 g/dL — ABNORMAL LOW (ref 12.0–15.0)
MCH: 29.6 pg (ref 26.0–34.0)
MCH: 29.8 pg (ref 26.0–34.0)
MCHC: 33.1 g/dL (ref 30.0–36.0)
MCHC: 33.2 g/dL (ref 30.0–36.0)
MCV: 89.4 fL (ref 80.0–100.0)
MCV: 89.7 fL (ref 80.0–100.0)
Platelets: 136 10*3/uL — ABNORMAL LOW (ref 150–400)
Platelets: 135 10*3/uL — ABNORMAL LOW (ref 150–400)
RBC: 4.36 MIL/uL (ref 3.87–5.11)
RBC: 3.99 MIL/uL (ref 3.87–5.11)
RDW: 12.5 % (ref 11.5–15.5)
RDW: 12.6 % (ref 11.5–15.5)
WBC: 8.5 10*3/uL (ref 4.0–10.5)
WBC: 13.6 10*3/uL — ABNORMAL HIGH (ref 4.0–10.5)
nRBC: 0 % (ref 0.0–0.2)
nRBC: 0 % (ref 0.0–0.2)

## 2018-11-16 LAB — TYPE AND SCREEN
ABO/RH(D): O POS
Antibody Screen: NEGATIVE

## 2018-11-16 LAB — RPR: RPR Ser Ql: NONREACTIVE

## 2018-11-16 MED ORDER — DIPHENHYDRAMINE HCL 25 MG PO CAPS: 25.0000 mg | ORAL_CAPSULE | Freq: Four times a day (QID) | ORAL | Status: DC | PRN

## 2018-11-16 MED ORDER — OXYTOCIN 40 UNITS IN NORMAL SALINE INFUSION - SIMPLE MED
1.0000 m[IU]/min | INTRAVENOUS | Status: DC
  Administered 2018-11-16: 2 m[IU]/min via INTRAVENOUS

## 2018-11-16 MED ORDER — SODIUM CHLORIDE 0.9 % IV SOLN
5.0000 10*6.[IU] | Freq: Once | INTRAVENOUS | Status: AC
  Administered 2018-11-16: 5 10*6.[IU] via INTRAVENOUS
  Filled 2018-11-16: qty 5

## 2018-11-16 MED ORDER — OXYCODONE-ACETAMINOPHEN 5-325 MG PO TABS: 2.0000 | ORAL_TABLET | ORAL | Status: DC | PRN

## 2018-11-16 MED ORDER — LACTATED RINGERS IV SOLN: 500.0000 mL | INTRAVENOUS | Status: DC | PRN

## 2018-11-16 MED ORDER — ONDANSETRON HCL 4 MG/2ML IJ SOLN: 4.0000 mg | INTRAMUSCULAR | Status: DC | PRN

## 2018-11-16 MED ORDER — PRENATAL MULTIVITAMIN CH
1.0000 | ORAL_TABLET | Freq: Every day | ORAL | Status: DC
  Administered 2018-11-17 – 2018-11-18 (×2): 1 via ORAL
  Filled 2018-11-16 (×2): qty 1

## 2018-11-16 MED ORDER — DIBUCAINE (PERIANAL) 1 % EX OINT: 1.0000 "application " | TOPICAL_OINTMENT | CUTANEOUS | Status: DC | PRN

## 2018-11-16 MED ORDER — TETANUS-DIPHTH-ACELL PERTUSSIS 5-2.5-18.5 LF-MCG/0.5 IM SUSP: 0.5000 mL | Freq: Once | INTRAMUSCULAR | Status: DC

## 2018-11-16 MED ORDER — ONDANSETRON HCL 4 MG PO TABS: 4.0000 mg | ORAL_TABLET | ORAL | Status: DC | PRN

## 2018-11-16 MED ORDER — BENZOCAINE-MENTHOL 20-0.5 % EX AERO: 1.0000 "application " | INHALATION_SPRAY | CUTANEOUS | Status: DC | PRN

## 2018-11-16 MED ORDER — ACETAMINOPHEN 325 MG PO TABS: 650.0000 mg | ORAL_TABLET | ORAL | Status: DC | PRN

## 2018-11-16 MED ORDER — COCONUT OIL OIL: 1.0000 "application " | TOPICAL_OIL | Status: DC | PRN

## 2018-11-16 MED ORDER — SOD CITRATE-CITRIC ACID 500-334 MG/5ML PO SOLN: 30.0000 mL | ORAL | Status: DC | PRN

## 2018-11-16 MED ORDER — LIDOCAINE-EPINEPHRINE (PF) 2 %-1:200000 IJ SOLN
INTRAMUSCULAR | Status: DC | PRN
  Administered 2018-11-16: 2 mL via EPIDURAL
  Administered 2018-11-16: 3 mL via EPIDURAL

## 2018-11-16 MED ORDER — SODIUM CHLORIDE (PF) 0.9 % IJ SOLN
INTRAMUSCULAR | Status: DC | PRN
  Administered 2018-11-16: 12 mL/h via EPIDURAL

## 2018-11-16 MED ORDER — PENICILLIN G 3 MILLION UNITS IVPB - SIMPLE MED
3.0000 10*6.[IU] | INTRAVENOUS | Status: DC
  Administered 2018-11-16 (×2): 3 10*6.[IU] via INTRAVENOUS
  Filled 2018-11-16 (×2): qty 100

## 2018-11-16 MED ORDER — FENTANYL CITRATE (PF) 100 MCG/2ML IJ SOLN: 100.0000 ug | INTRAMUSCULAR | Status: DC | PRN

## 2018-11-16 MED ORDER — METHYLERGONOVINE MALEATE 0.2 MG/ML IJ SOLN
INTRAMUSCULAR | Status: AC
  Administered 2018-11-16: 0.2 mg via INTRAMUSCULAR
  Filled 2018-11-16: qty 1

## 2018-11-16 MED ORDER — EPHEDRINE 5 MG/ML INJ: 10.0000 mg | INTRAVENOUS | Status: DC | PRN

## 2018-11-16 MED ORDER — TERBUTALINE SULFATE 1 MG/ML IJ SOLN: 0.2500 mg | Freq: Once | INTRAMUSCULAR | Status: DC | PRN

## 2018-11-16 MED ORDER — MISOPROSTOL 200 MCG PO TABS: 1000.0000 ug | ORAL_TABLET | Freq: Once | ORAL | Status: DC

## 2018-11-16 MED ORDER — OXYTOCIN BOLUS FROM INFUSION
500.0000 mL | Freq: Once | INTRAVENOUS | Status: AC
  Administered 2018-11-16: 500 mL via INTRAVENOUS

## 2018-11-16 MED ORDER — ONDANSETRON HCL 4 MG/2ML IJ SOLN: 4.0000 mg | Freq: Four times a day (QID) | INTRAMUSCULAR | Status: DC | PRN

## 2018-11-16 MED ORDER — MISOPROSTOL 200 MCG PO TABS
1000.0000 ug | ORAL_TABLET | Freq: Once | ORAL | Status: AC
  Administered 2018-11-16: 20:00:00 1000 ug via BUCCAL

## 2018-11-16 MED ORDER — LACTATED RINGERS IV SOLN: 500.0000 mL | Freq: Once | INTRAVENOUS | Status: DC

## 2018-11-16 MED ORDER — OXYCODONE-ACETAMINOPHEN 5-325 MG PO TABS: 1.0000 | ORAL_TABLET | ORAL | Status: DC | PRN

## 2018-11-16 MED ORDER — SENNOSIDES-DOCUSATE SODIUM 8.6-50 MG PO TABS
2.0000 | ORAL_TABLET | ORAL | Status: DC
  Administered 2018-11-16 – 2018-11-17 (×2): 2 via ORAL
  Filled 2018-11-16 (×2): qty 2

## 2018-11-16 MED ORDER — PHENYLEPHRINE 40 MCG/ML (10ML) SYRINGE FOR IV PUSH (FOR BLOOD PRESSURE SUPPORT)
80.0000 ug | PREFILLED_SYRINGE | INTRAVENOUS | Status: DC | PRN
  Filled 2018-11-16: qty 10

## 2018-11-16 MED ORDER — IBUPROFEN 600 MG PO TABS
600.0000 mg | ORAL_TABLET | Freq: Four times a day (QID) | ORAL | Status: DC
  Administered 2018-11-16 – 2018-11-18 (×7): 600 mg via ORAL
  Filled 2018-11-16 (×7): qty 1

## 2018-11-16 MED ORDER — SIMETHICONE 80 MG PO CHEW: 80.0000 mg | CHEWABLE_TABLET | ORAL | Status: DC | PRN

## 2018-11-16 MED ORDER — METHYLERGONOVINE MALEATE 0.2 MG/ML IJ SOLN
0.2000 mg | Freq: Once | INTRAMUSCULAR | Status: AC
  Administered 2018-11-16: 21:00:00 0.2 mg via INTRAMUSCULAR

## 2018-11-16 MED ORDER — ZOLPIDEM TARTRATE 5 MG PO TABS: 5.0000 mg | ORAL_TABLET | Freq: Every evening | ORAL | Status: DC | PRN

## 2018-11-16 MED ORDER — FENTANYL-BUPIVACAINE-NACL 0.5-0.125-0.9 MG/250ML-% EP SOLN
12.0000 mL/h | EPIDURAL | Status: DC | PRN
  Filled 2018-11-16: qty 250

## 2018-11-16 MED ORDER — PHENYLEPHRINE 40 MCG/ML (10ML) SYRINGE FOR IV PUSH (FOR BLOOD PRESSURE SUPPORT): 80.0000 ug | PREFILLED_SYRINGE | INTRAVENOUS | Status: DC | PRN

## 2018-11-16 MED ORDER — WITCH HAZEL-GLYCERIN EX PADS: 1.0000 "application " | MEDICATED_PAD | CUTANEOUS | Status: DC | PRN

## 2018-11-16 MED ORDER — DIPHENHYDRAMINE HCL 50 MG/ML IJ SOLN: 12.5000 mg | INTRAMUSCULAR | Status: DC | PRN

## 2018-11-16 MED ORDER — LIDOCAINE HCL (PF) 1 % IJ SOLN: 30.0000 mL | INTRAMUSCULAR | Status: DC | PRN

## 2018-11-16 MED ORDER — LACTATED RINGERS IV SOLN
INTRAVENOUS | Status: DC
  Administered 2018-11-16 (×2): via INTRAVENOUS

## 2018-11-16 MED ORDER — OXYTOCIN 40 UNITS IN NORMAL SALINE INFUSION - SIMPLE MED
2.5000 [IU]/h | INTRAVENOUS | Status: DC
  Filled 2018-11-16: qty 1000

## 2018-11-16 MED ORDER — MISOPROSTOL 200 MCG PO TABS
ORAL_TABLET | ORAL | Status: AC
  Administered 2018-11-16: 20:00:00 1000 ug via BUCCAL
  Filled 2018-11-16: qty 5

## 2018-11-16 NOTE — Progress Notes (Signed)
Pt and FOB expresses interest and concerns about epidural since 0730. Dr Lanetta Inch called and at bedside for consult and to answer questions with interpreter. Pt and FOB have expressed concern over not receiving epidural during last delivery due to labs processing (per pt and FOB report). Pt and FOB encouraged to ask for epidural and not wait for increased pain level or advanced dilitation to ask for epidural (pt and FOB state understanding per interpreter).

## 2018-11-16 NOTE — Progress Notes (Signed)
Lisa Hodge is a 33 y.o. G2P1001 at [redacted]w[redacted]d.  Subjective: Comfortable w/ epidural.   Objective: BP 123/81   Pulse 90   Temp 98.4 F (36.9 C) (Oral)   Resp 18   Ht 5\' 4"  (1.626 m)   Wt 73 kg   LMP 02/03/2018 (Approximate)   SpO2 100%   BMI 27.64 kg/m    FHT:  FHR: 140 bpm, variability: 15x15,  accelerations:  15x15,  decelerations:  Few variables after AROM. Improved w/ position change.  UC:   Q 3 minutes, moderate  Dilation: 7 Effacement (%): 90 Cervical Position: Middle Station: 0 Presentation: Vertex Exam by:: vsmith,cnm AROM mod amount of clear fluid. Significant descent after AROM.    Labs: NA  Assessment / Plan: [redacted]w[redacted]d week IUP Labor: Transition  Fetal Wellbeing:  Category I-II, overall reassuring  Pain Control:  Epidural. Was comfortable. Now with some left-sided abd pain after AROM. Suspect R/T significant descent from -2 to 0 after AROM.   Anticipated MOD:  SVD  Tamala Julian [place], [place] 11/16/2018 3:31 PM

## 2018-11-16 NOTE — Progress Notes (Signed)
Interpreter paged and requested upon pt entrance to room for admission. Raquel, [place] interpreter present for admission. FOB expresses concern that due date from [place] and [place] are off by 1 week and that pt has until August to deliver. FP called to see pt while interpreter present. Pt states she is 4cm.

## 2018-11-16 NOTE — Anesthesia Preprocedure Evaluation (Signed)

## 2018-11-16 NOTE — Progress Notes (Signed)
Lisa Hodge Lisa Hodge is a 33 y.o. G2P1001 at [redacted]w[redacted]d.  Subjective: Rates pain 1/10. Many questions about epidural and IV pain meds.   Objective: BP 121/84   Pulse 75   Temp 98.4 F (36.9 C) (Oral)   Resp 18   Ht 5\' 4"  (1.626 m)   Wt 73 kg   LMP 02/03/2018 (Approximate)   BMI 27.64 kg/m    FHT:  FHR: 140 bpm, variability: mod,  accelerations:  15x15,  decelerations:  none UC:   Q 2-4 minutes, mild Dilation: 6 Effacement (%): 90 Cervical Position: Middle Station: -2 Presentation: Vertex Exam by:: [email or web address]  Labs: Results for orders placed or performed during the hospital encounter of 11/16/18 (from the past 24 hour(s))  CBC     Status: Abnormal   Collection Time: 11/16/18  7:29 AM  Result Value Ref Range   WBC 8.5 4.0 - 10.5 K/uL   RBC 4.36 3.87 - 5.11 MIL/uL   Hemoglobin 12.9 12.0 - 15.0 g/dL   HCT 39.0 36.0 - 46.0 %   MCV 89.4 80.0 - 100.0 fL   MCH 29.6 26.0 - 34.0 pg   MCHC 33.1 30.0 - 36.0 g/dL   RDW 12.5 11.5 - 15.5 %   Platelets 136 (L) 150 - 400 K/uL   nRBC 0.0 0.0 - 0.2 %  Type and screen     Status: None   Collection Time: 11/16/18  7:29 AM  Result Value Ref Range   ABO/RH(D) O POS    Antibody Screen NEG    Sample Expiration      11/19/2018,2359 Performed at [facility], [place] [address], [place], [place] [address]   ABO/Rh     Status: None   Collection Time: 11/16/18  7:29 AM  Result Value Ref Range   ABO/RH(D)      O POS Performed at [facility] [address], [place], [place] [address]     Assessment / Plan: [redacted]w[redacted]d week IUP Labor: Early Fetal Wellbeing:  Category I Pain Control:  None Anticipated MOD:  SVD Continue increasing pitocin. Consider AROM at next check. Anesthesiologist called to BS to discussed epidural questions and concerns.  Live interpreter at [facility].   Tamala Julian, [place], [place] 11/16/2018 1:05 PM

## 2018-11-16 NOTE — Anesthesia Procedure Notes (Signed)
Epidural Patient location during procedure: OB Start time: 11/16/2018 2:50 PM End time: 11/16/2018 3:05 PM  Staffing Anesthesiologist: Freddrick March, MD Performed: anesthesiologist   Preanesthetic Checklist Completed: patient identified, pre-op evaluation, timeout performed, IV checked, risks and benefits discussed and monitors and equipment checked  Epidural Patient position: sitting Prep: site prepped and draped and DuraPrep Patient monitoring: continuous pulse ox, blood pressure, heart rate and cardiac monitor Approach: midline Location: L3-L4 Injection technique: LOR air  Needle:  Needle type: Tuohy  Needle gauge: 17 G Needle length: 9 cm Needle insertion depth: 5 cm Catheter type: closed end flexible Catheter size: 19 Gauge Catheter at skin depth: 11 cm Test dose: negative  Assessment Sensory level: T8 Events: blood not aspirated, injection not painful, no injection resistance, negative IV test and no paresthesia  Additional Notes Patient identified. Risks/Benefits/Options discussed with patient including but not limited to bleeding, infection, nerve damage, paralysis, failed block, incomplete pain control, headache, blood pressure changes, nausea, vomiting, reactions to medication both or allergic, itching and postpartum back pain. Confirmed with bedside nurse the patient's most recent platelet count. Confirmed with patient that they are not currently taking any anticoagulation, have any bleeding history or any family history of bleeding disorders. Patient expressed understanding and wished to proceed. All questions were answered. Sterile technique was used throughout the entire procedure. Please see nursing notes for vital signs. Test dose was given through epidural catheter and negative prior to continuing to dose epidural or start infusion. Warning signs of high block given to the patient including shortness of breath, tingling/numbness in hands, complete motor block,  or any concerning symptoms with instructions to call for help. Patient was given instructions on fall risk and not to get out of bed. All questions and concerns addressed with instructions to call with any issues or inadequate analgesia.  Reason for block:procedure for pain

## 2018-11-16 NOTE — Discharge Summary (Addendum)
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 yo G2P1001 at 7352w1d was admitted to the hospital 11/16/2018 for induction of labor.  Indication for induction: Favorable cervix at term.  Patient had an uncomplicated labor course as follows: Membrane Rupture Time/Date: 3:21 PM ,11/16/2018   Intrapartum Procedures: Episiotomy: None [1]                                         Lacerations:  1st degree [2];Perineal [11]  Patient had delivery of a Viable infant.  Information for the patient's newborn:  Julien Nordmannenafiel Hodge, Boy Shareen [[id number]][[id number]]      11/16/2018  Details of delivery can be found in separate delivery note.  Patient had a routine postpartum course. Patient is discharged home 11/18/18.  Magnesium Sulfate recieved: No BMZ received: No  Physical exam  Vitals:   11/17/18 0746 11/17/18 1352 11/17/18 2134 11/18/18 0615  BP: 111/74 93/64 96/68  94/71  Pulse: (!) 57 76 76 64  Resp: 20 17 16 16   Temp: 97.7 F (36.5 C) 98.7 F (37.1 C) 97.7 F (36.5 C) 98 F (36.7 C)  TempSrc: Oral Oral Oral Oral  SpO2:  99% 99% 100%  Weight:      Height:       General: alert, cooperative and no distress Lochia: appropriate Uterine Fundus: firm Incision: N/A DVT Evaluation: No evidence of DVT seen on physical exam. Negative Homan's sign. No cords or calf  tenderness. Labs: Lab Results  Component Value Date   WBC 11.7 (H) 11/17/2018   HGB 10.8 (L) 11/17/2018   HCT 32.2 (L) 11/17/2018   MCV 88.2 11/17/2018   PLT 111 (L) 11/17/2018   No flowsheet data found.  Discharge instruction: per After Visit Summary and "Baby and Me Booklet".  After visit meds:  Allergies as of 11/18/2018      Reactions   Latex Rash      Medication List    STOP taking these medications   calcium acetate 667 MG capsule Commonly known as: PHOSLO   multivitamin-prenatal 27-0.8 MG Tabs tablet     TAKE these medications   ibuprofen 600 MG tablet Commonly known as: ADVIL Take 1 tablet (600 mg total) by mouth every 6 (six) hours.       Diet: routine diet  Activity: Advance as tolerated. Pelvic rest for 6  weeks.   Outpatient follow up:4 weeks Follow up Appt:No future appointments. Follow up Visit: [facility] Schedule an appointment as soon as possible for a visit in 4 week(s).   Specialty: Obstetrics and Gynecology Why: for postpartum checkup Contact information: [address] [place], [place] [id number] [place] [id number] [phone]           Please schedule this patient for Postpartum visit in: 4 weeks with the following provider: Any provider For C/S patients schedule nurse incision check in weeks 2 weeks: no Low risk pregnancy complicated by: Nothing Delivery mode:  SVD Anticipated Birth Control:  other/unsure.  Discussed methods, pt will let us know at postpartum appointment PP Procedures needed: None  Schedule Integrated BH visit: no      Newborn Data: Live born female  Birth Weight:  8 lbs 6 oz APGAR: 9, 9  Newborn Delivery   Birth date/time: 11/16/2018 19:03:00 Delivery type: Vaginal, Spontaneous      Baby Feeding: Breast Disposition:home with mother   11/18/2018 Christin Fudge, CNM  Attestation of Attending  Supervision of Advanced Practice Provider (PA/CNM/NP): Evaluation and management procedures were performed by the Advanced Practice Provider under my supervision and collaboration.  I have reviewed the Advanced Practice Provider's note and chart, and I agree with the management and plan.  Verita Schneiders, MD, [place] for [employer], [place]

## 2018-11-16 NOTE — Progress Notes (Signed)
[id number] Spoke to VSmith,cnm and Resident MD's. Providers will talk to pt and FOB after rounds and reviewing chart closely and will speak to pt and FOB with interpreter. IOL method on hold until pt and FOB in agreement. Pt and FOB given POC by interpreter and state understanding.

## 2018-11-16 NOTE — H&P (Addendum)
LABOR AND DELIVERY ADMISSION HISTORY AND PHYSICAL NOTE  Lisa StallsClaudia Penafiel Hodge is a 33 y.o. female G2P1001 with IUP at 1444w6d by LMP/ 7051w1d by US presenting for IOL for post dates.  She reports positive fetal movement. She denies leakage of fluid or vaginal bleeding.  P[facility]/Complications[facility] Pregnancy complications:  - none  Past Medical History: Past Medical History:  Diagnosis Date  . Medical history non-contributory     Past Surgical History: Past Surgical History:  Procedure Laterality Date  . NO PAST SURGERIES      Obstetrical History: OB History    Gravida  2   Para  1   Term  1   Preterm      AB      Living  1     SAB      TAB      Ectopic      Multiple  0   Live Births  1           Social History: Social History   Socioeconomic History  . Marital status: Single    Spouse name: Not on file  . Number of children: Not on file  . Years of education: Not on file  . Highest education level: Not on file  Occupational History  . Not on file  Social Needs  . Financial resource strain: Not on file  . Food insecurity    Worry: Not on file    Inability: Not on file  . Transportation needs    Medical: Not on file    Non-medical: Not on file  Tobacco Use  . Smoking status: Never Smoker  . Smokeless tobacco: Never Used  Substance and Sexual Activity  . Alcohol use: Yes    Frequency: Never    Comment: Socially  . Drug use: No  . Sexual activity: Yes    Birth control/protection: None  Lifestyle  . Physical activity    Days per week: Not on file    Minutes per session: Not on file  . Stress: Not on file  Relationships  . Social Musicianconnections    Talks on phone: Not on file    Gets together: Not on file    Attends religious service: Not on file    Active member of club or organization: Not on file    Attends meetings of clubs or organizations: Not on file    Relationship status: Not on file  Other Topics Concern  . Not  on file  Social History Narrative  . Not on file    Family History: Family History  Problem Relation Age of Onset  . Heart disease Mother   . Hypertension Mother     Allergies: Allergies  Allergen Reactions  . Latex Rash    Medications Prior to Admission  Medication Sig Dispense Refill Last Dose  . acetaminophen (TYLENOL) 325 MG tablet Take 650 mg by mouth every 6 (six) hours as needed.     . calcium acetate (PHOSLO) 667 MG capsule Take 667 mg by mouth 3 (three) times daily with meals.      . Prenatal Vit-Fe Fumarate-FA (MULTIVITAMIN-PRENATAL) 27-0.8 MG TABS tablet Take 1 tablet by mouth daily at 12 noon.        Review of Systems  All systems reviewed and negative except as stated in HPI  Physical Exam Temperature 98.4 F (36.9 C), temperature source Oral, resp. rate 18, height 5\' 4"  (1.626 m), weight 73 kg, last menstrual period 02/03/2018, not  currently breastfeeding. General appearance: alert, oriented Lungs: normal respiratory effort Heart: regular rate Abdomen: soft, non-tender; gravid, FH appropriate for GA Extremities: No calf swelling or tenderness Presentation: cephalic Fetal monitoring: 140 bpm, moderate variability, 15 x 15 acels, no decels Uterine activity: 4 mins    Prenatal labs: ABO, Rh: --/--/O POS (07/26 0729) Antibody: NEG (07/26 0729) Rubella: 11.80 (05/12 0956) RPR: Non Reactive (05/05 0854)  HBsAg: Negative (05/12 0956)  HIV: Non Reactive (05/05 0854)  GC/Chlamydia: Negative (07/08 0000) GBS: Positive (07/07 0000)  2-hr GTT: WNL Genetic screening: Normal Anatomy US: Normal  Prenatal Transfer Tool  Maternal Diabetes: No Genetic Screening: Normal Maternal Ultrasounds/Referrals: Normal Fetal Ultrasounds or other Referrals:  None Maternal Substance Abuse:  No Significant Maternal Medications:  None Significant Maternal Lab Results: Group B Strep positive  Results for orders placed or performed during the hospital encounter of 11/16/18  (from the past 24 hour(s))  CBC   Collection Time: 11/16/18  7:29 AM  Result Value Ref Range   WBC 8.5 4.0 - 10.5 K/uL   RBC 4.36 3.87 - 5.11 MIL/uL   Hemoglobin 12.9 12.0 - 15.0 g/dL   HCT 39.0 36.0 - 46.0 %   MCV 89.4 80.0 - 100.0 fL   MCH 29.6 26.0 - 34.0 pg   MCHC 33.1 30.0 - 36.0 g/dL   RDW 12.5 11.5 - 15.5 %   Platelets 136 (L) 150 - 400 K/uL   nRBC 0.0 0.0 - 0.2 %  Type and screen   Collection Time: 11/16/18  7:29 AM  Result Value Ref Range   ABO/RH(D) O POS    Antibody Screen NEG    Sample Expiration      11/19/2018,2359 Performed at [facility], [place] [address], [place], [place] [address]     Patient Active Problem List   Diagnosis Date Noted  . Post-dates pregnancy 11/16/2018  . Group B streptococcal bacteriuria 11/01/2018  . Supervision of other normal pregnancy, antepartum 07/25/2018    Assessment: Lisa Hodge is a 33 y.o. G2P1001 at [redacted]w[redacted]d here for IOL for post dates.  #Labor: Continue time appropriate cervical exams for progression of labor. Start Pitocin 2 by 2.  #Pain: May have epidural if desired. #FWB: Cat I, cephalic #ID: GBS PCN, ppx #MOF: Both #MOC: Undecided #Circ:  No  Gerlene Fee, DO Family Medicine Resident, PGY-1 11/16/2018, 8:32 AM   I was present for the exam and agree with above.  Tamala Julian, [place], [place] 11/16/2018 1:09 PM

## 2018-11-16 NOTE — Progress Notes (Signed)
Called to bedside by RN to address continued vaginal bleeding after cytotec dose.   Continue trickling noted from vagina, uterus boggy on palpation. Bimanual examination performed and passed 1 large clot and multiple small clots, totaling 175mL.   Uterus firm after uterus sweep. Increased vaginal bleeding d/t uterine atony, methergine  0.2mg  IM given to patient prior to transfer to MB.   Reassess vaginal bleeding and uterine tone prior to transfer   Lajean Manes, CNM 11/16/18, 8:52 PM

## 2018-11-16 NOTE — Progress Notes (Signed)
Interpreter Roman [phone] used by [place] to talk to pt with dr Erskine Speed -[place] Drafts.

## 2018-11-17 ENCOUNTER — Other Ambulatory Visit: Payer: Medicaid Other

## 2018-11-17 ENCOUNTER — Encounter: Payer: Medicaid Other | Admitting: Advanced Practice Midwife

## 2018-11-17 LAB — CBC
HCT: 32.2 % — ABNORMAL LOW (ref 36.0–46.0)
Hemoglobin: 10.8 g/dL — ABNORMAL LOW (ref 12.0–15.0)
MCH: 29.6 pg (ref 26.0–34.0)
MCHC: 33.5 g/dL (ref 30.0–36.0)
MCV: 88.2 fL (ref 80.0–100.0)
Platelets: 111 10*3/uL — ABNORMAL LOW (ref 150–400)
RBC: 3.65 MIL/uL — ABNORMAL LOW (ref 3.87–5.11)
RDW: 12.5 % (ref 11.5–15.5)
WBC: 11.7 10*3/uL — ABNORMAL HIGH (ref 4.0–10.5)
nRBC: 0 % (ref 0.0–0.2)

## 2018-11-17 LAB — ABO/RH: ABO/RH(D): O POS

## 2018-11-17 NOTE — Lactation Note (Signed)
This note was copied from a baby's chart. Lactation Consultation Note Baby 6 hrs old. Breast/formula feeding. FOB stated he would interpret for mom. Refused need for interpreter. Mom relies on FOB, she doesn't understand any [place]. Baby has been spitting up. Gave him a little formula, baby spit it up. Mom has a 35 yr old at home. Mom is still BF that child. Mom has Large nipples. Baby not interest in feeding at this time. Will take nipple then tongue thrust out. Noted abd. Slightly distended. LC has used suction bulb 2 times during consult. LC changed stool diaper. Mom stated baby BF great after delivery but hasn't BF well since.  Newborn feeding habits and behavior discussed. Hand expression demonstrated clear colostrum. Mom wondering where her milk went. LC explained how the milk changes after delivery for the baby because the colostrum is so important.  Discussed STS, I&O, cluster feeding, breast massage. Encouraged to call for assistance or questions. Lactation brochure given in [place].  Patient Name: Lisa Hodge [id number] Date: 11/17/2018 Reason for consult: Initial assessment;Term   Maternal Data Has patient been taught Hand Expression?: Yes Does the patient have breastfeeding experience prior to this delivery?: Yes  Feeding Feeding Type: Bottle Fed - Formula Nipple Type: Slow - flow  LATCH Score Latch: Too sleepy or reluctant, no latch achieved, no sucking elicited.  Audible Swallowing: None  Type of Nipple: Everted at rest and after stimulation  Comfort (Breast/Nipple): Soft / non-tender  Hold (Positioning): Assistance needed to correctly position infant at breast and maintain latch.  LATCH Score: 5  Interventions Interventions: Breast feeding basics reviewed;Adjust position;Assisted with latch;Skin to skin;Breast massage;Hand express;Breast compression  Lactation Tools Discussed/Used WIC Program: Yes   Consult Status Consult Status:  Follow-up Date: 11/18/18 Follow-up type: In-patient    Crixus Mcaulay, [place] 11/17/2018, 1:25 AM

## 2018-11-17 NOTE — Progress Notes (Signed)
I check on pt need,by Juliann Mule Spanish Interpreter

## 2018-11-17 NOTE — Progress Notes (Signed)
Post Partum Day 1 Subjective: no complaints, up ad lib, voiding and tolerating PO  Objective: Blood pressure 111/74, pulse (!) 57, temperature 97.7 F (36.5 C), temperature source Oral, resp. rate 20, height 5\' 4"  (1.626 m), weight 73 kg, last menstrual period 02/03/2018, SpO2 99 %, unknown if currently breastfeeding.  Physical Exam:  General: alert, cooperative and no distress Lochia: appropriate Uterine Fundus: firm DVT Evaluation: No evidence of DVT seen on physical exam. Negative Homan's sign. No cords or calf tenderness. No significant calf/ankle edema.  Recent Labs    11/16/18 1934 11/17/18 0403  HGB 11.9* 10.8*  HCT 35.8* 32.2*    Assessment/Plan: Plan for discharge tomorrow, Breastfeeding, Lactation consult and Contraception undecided- reviewed all methods today  Live interpreter present   LOS: 1 day   Julianne Handler, CNM 11/17/2018, 10:54 AM

## 2018-11-17 NOTE — Anesthesia Postprocedure Evaluation (Signed)
Anesthesia Post Note  Patient: Lisa Hodge  Procedure(s) Performed: AN AD HOC LABOR EPIDURAL     Patient location during evaluation: Mother Baby Anesthesia Type: Epidural Level of consciousness: awake and alert Pain management: pain level controlled Vital Signs Assessment: post-procedure vital signs reviewed and stable Respiratory status: spontaneous breathing, nonlabored ventilation and respiratory function stable Cardiovascular status: stable Postop Assessment: no headache, no backache and epidural receding Anesthetic complications: no Comments: Spoke with pt and nurse on th phone. There is a language barrier, but nurse stats pt is stable and has had no complaints. No anesthetic complications noted.    Last Vitals:  Vitals:   11/17/18 0340 11/17/18 0746  BP: 107/71 111/74  Pulse: 64 (!) 57  Resp: 18 20  Temp: 36.6 C 36.5 C  SpO2: 99%     Last Pain:  Vitals:   11/17/18 0746  TempSrc: Oral  PainSc:    Pain Goal:                   Riki Sheer

## 2018-11-17 NOTE — Discharge Instructions (Signed)
Parto vaginal, cuidados de puerperio Postpartum Care After Vaginal Delivery Lea esta informacin sobre cmo cuidarse desde el momento en que nazca su beb y hasta 6 a 12 semanas despus del parto (perodo del posparto). El mdico tambin podr darle instrucciones ms especficas. Comunquese con su mdico si tiene problemas o preguntas. Siga estas indicaciones en su casa: Hemorragia vaginal  Es normal tener un poco de hemorragia vaginal (loquios) despus del parto. Use un apsito sanitario para el sangrado vaginal y secrecin. ? Durante la primera semana despus del parto, la cantidad y el aspecto de los loquios a menudo es similar a las del perodo menstrual. ? Durante las siguientes semanas disminuir gradualmente hasta convertirse en una secrecin seca amarronada o amarillenta. ? En la mayora de las mujeres, los loquios se detienen completamente entre 4 a 6semanas despus del parto. Los sangrados vaginales pueden variar de mujer a mujer.  Cambie los apsitos sanitarios con frecuencia. Observe si hay cambios en el flujo, como: ? Un aumento repentino en el volumen. ? Cambio en el color. ? Cogulos sanguneos grandes.  Si expulsa un cogulo de sangre por la vagina, gurdelo y llame al mdico para informrselo. No deseche los cogulos de sangre por el inodoro antes de hablar con su mdico.  No use tampones ni se haga duchas vaginales hasta que el mdico la autorice.  Si no est amamantando, volver a tener su perodo entre 6 y 8 semanas despus del parto. Si solamente alimenta al beb con leche materna (lactancia materna exclusiva), podra no volver a tener su perodo hasta que deje de amamantar. Cuidados perineales  Mantenga la zona entre la vagina y el ano (perineo) limpia y seca, como se lo haya indicado el mdico. Utilice apsitos o aerosoles analgsicos y cremas, como se lo hayan indicado.  Si le hicieron un corte en el perineo (episiotoma) o tuvo un desgarro en la vagina, controle la  zona para detectar signos de infeccin hasta que sane. Est atenta a los siguientes signos: ? Aumento del enrojecimiento, la hinchazn o el dolor. ? Presenta lquido o sangre que supura del corte o desgarro. ? Calor. ? Pus o mal olor.  Es posible que le den una botella rociadora para que use en lugar de limpiarse el rea con papel higinico despus de usar el bao. Cuando comience a sanar, podr usar la botella rociadora antes de secarse. Asegrese de secarse suavemente.  Para aliviar el dolor causado por una episiotoma, un desgarro en la vagina o venas hinchadas en el ano (hemorroides), trate de tomar un bao de asiento tibio 2 o 3 veces por da. Un bao de asiento es un bao de agua tibia que se toma mientras se est sentado. El agua solo debe llegar hasta las caderas y cubrir las nalgas. Cuidado de las mamas  En los primeros das despus del parto, las mamas pueden sentirse pesadas, llenas e incmodas (congestin mamaria). Tambin puede escaparse leche de sus senos. El mdico puede sugerirle mtodos para aliviar este malestar. La congestin mamaria debera desaparecer al cabo de unos das.  Si est amamantando: ? Use un sostn que sujete y ajuste bien sus pechos. ? Mantenga los pezones secos y limpios. Aplquese cremas y ungentos, como se lo haya indicado el mdico. ? Es posible que deba usar discos de algodn en el sostn para absorber la leche que se filtre de sus senos. ? Puede tener contracciones uterinas cada vez que amamante durante varias semanas despus del parto. Las contracciones uterinas ayudan al tero a   regresar a su tamao habitual. ? Si tiene algn problema con la lactancia materna, colabore con el mdico o un asesor en lactancia.  Si no est amamantando: ? Evite tocarse mucho las mamas. Al hacerlo, podran producir ms leche. ? Use un sostn que le proporcione el ajuste correcto y compresas fras para reducir la hinchazn. ? No extraiga (saque) leche materna. Esto har que  produzca ms leche. Intimidad y sexualidad  Pregntele al mdico cundo puede retomar la actividad sexual. Esto puede depender de lo siguiente: ? Su riesgo de sufrir infecciones. ? La rapidez con la que est sanando. ? Su comodidad y deseo de retomar la actividad sexual.  Despus del parto, puede quedar embarazada incluso si no ha tenido todava su perodo. Si lo desea, hable con el mdico acerca de los mtodos de control de la natalidad (mtodos anticonceptivos). Medicamentos  Tome los medicamentos de venta libre y los recetados solamente como se lo haya indicado el mdico.  Si le recetaron un antibitico, tmelo como se lo haya indicado el mdico. No deje de tomar el antibitico aunque comience a sentirse mejor. Actividad  Retome sus actividades normales de a poco como se lo haya indicado el mdico. Pregntele al mdico qu actividades son seguras para usted.  Descanse todo lo que pueda. Trate de descansar o tomar una siesta mientras el beb duerme. Comida y bebida   Beba suficiente lquido como para mantener la orina de color amarillo plido.  Coma alimentos ricos en fibras todos los das. Estos pueden ayudarla a prevenir o aliviar el estreimiento. Los alimentos ricos en fibras incluyen, entre otros: ? Panes y cereales integrales. ? Arroz integral. ? Frijoles. ? Frutas y verduras frescas.  No intente perder de peso rpidamente reduciendo el consumo de caloras.  Tome sus vitaminas prenatales hasta la visita de seguimiento de posparto o hasta que su mdico le indique que puede dejar de tomarlas. Estilo de vida  No consuma ningn producto que contenga nicotina o tabaco, como cigarrillos y cigarrillos electrnicos. Si necesita ayuda para dejar de fumar, consulte al mdico.  No beba alcohol, especialmente si est amamantando. Instrucciones generales  Concurra a todas las visitas de seguimiento para usted y el beb, como se lo haya indicado el mdico. La mayora de las mujeres  visita al mdico para un seguimiento de posparto dentro de las primeras 3 a 6 semanas despus del parto. Comunquese con un mdico si:  Se siente incapaz de controlar los cambios que implica tener un hijo y esos sentimientos no desaparecen.  Siente tristeza o preocupacin de forma inusual.  Las mamas se ponen rojas, le duelen o se endurecen.  Tiene fiebre.  Tiene dificultad para retener la orina o para impedir que la orina se escape.  Tiene poco inters o falta de inters en actividades que solan gustarle.  No ha amamantado nada y no ha tenido un perodo menstrual durante 12 semanas despus del parto.  Dej de amamantar al beb y no ha tenido su perodo menstrual durante 12 semanas despus de dejar de amamantar.  Tiene preguntas sobre su cuidado y el del beb.  Elimina un cogulo de sangre grande por la vagina. Solicite ayuda de inmediato si:  Siente dolor en el pecho.  Tiene dificultad para respirar.  Tiene un dolor repentino e intenso en la pierna.  Tiene dolor intenso o clicos en el la parte inferior del abdomen.  Tiene una hemorragia tan intensa de la vagina que empapa ms de un apsito en una hora. El sangrado   no debe ser ms abundante que el perodo ms intenso que haya tenido.  Dolor de cabeza intenso.  Se desmaya.  Tiene visin borrosa o manchas en la vista.  Tiene secrecin vaginal con mal olor.  Tiene pensamientos acerca de lastimarse a usted misma o a su beb. Si alguna vez siente que puede lastimarse a usted misma o a otras personas, o tiene pensamientos de poner fin a su vida, busque ayuda de inmediato. Puede dirigirse al departamento de emergencias ms cercano o llamar a:  El servicio de emergencias de su localidad (911 en [place]  Una lnea de asistencia al suicida y atencin en crisis, como la Lnea Nacional de Prevencin del Suicidio (National Suicide Prevention Lifeline), al 1-800-273-8255. Est disponible las 24 horas del da. Resumen  El  perodo de tiempo justo despus el parto y hasta 6 a 12 semanas despus del parto se denomina perodo posparto.  Retome sus actividades normales de a poco como se lo haya indicado el mdico.  Concurra a todas las visitas de seguimiento para usted y el beb, como se lo haya indicado el mdico. Esta informacin no tiene como fin reemplazar el consejo del mdico. Asegrese de hacerle al mdico cualquier pregunta que tenga. Document Released: 02/04/2007 Document Revised: 07/20/2017 Document Reviewed: 03/31/2017 Elsevier Patient Education  2020 [employer].  

## 2018-11-18 MED ORDER — IBUPROFEN 600 MG PO TABS: 600.0000 mg | ORAL_TABLET | Freq: Four times a day (QID) | ORAL | 0 refills | Status: AC

## 2018-11-18 NOTE — Progress Notes (Signed)
Discharge education reviewed with patient and significant other using [facility], in house interpreter. Questions answered and patient ready to go when ride arrives. Maxwell Caul, Leretha Dykes [place]

## 2018-11-18 NOTE — Lactation Note (Addendum)
This note was copied from a baby's chart. Lactation Consultation Note;  Lisa Hodge ID # [id number],   Mother has been breast and bottle feeding this infant. She last breastfed infant at 48 am. Infant has had lots of bottles.  Discussed importance of latching infant frequently.  Advised to cue base feed infant and to then supplement infant if needed.   Mother ask  if ok to breastfeed 3 month old at home. Recommend that she breastfeed infant first and then breastfeed older child. Mother reports that toddler has teeth and will breastfeeding hurt his teeth.  Mother advised in information  from AAP about breastfeeding toddlers.   Mother reports not having a pump at home. She was given a harmony hand pump and instructions in use and cleaning.  Discussed treatment and prevention of engorgement.   Mother very receptive to all teaching.Mom encouraged to feed baby 8-12 times/24 hours and with feeding cues. Mom encouraged to feed baby w/feeding cues Mom encouraged to do skin-to-skin.om made aware of O/P services, breastfeeding support groups, community resources, and our phone # for post-discharge questions.    Patient Name: Lisa Hodge [id number] Date: 11/18/2018 Reason for consult: Follow-up assessment   Maternal Data    Feeding    LATCH Score                   Interventions    Lactation Tools Discussed/Used     Consult Status      [name] 11/18/2018, 10:23 AM

## 2018-12-01 ENCOUNTER — Encounter: Payer: Self-pay | Admitting: Family Medicine

## 2019-01-02 ENCOUNTER — Ambulatory Visit: Payer: Medicaid Other | Admitting: Obstetrics & Gynecology

## 2019-01-12 ENCOUNTER — Encounter: Payer: Self-pay | Admitting: Family Medicine

## 2019-01-12 ENCOUNTER — Other Ambulatory Visit: Payer: Self-pay

## 2019-01-12 ENCOUNTER — Ambulatory Visit (INDEPENDENT_AMBULATORY_CARE_PROVIDER_SITE_OTHER): Payer: Medicaid Other | Admitting: Family Medicine

## 2019-01-12 DIAGNOSIS — Z1389 Encounter for screening for other disorder: Secondary | ICD-10-CM | POA: Diagnosis not present

## 2019-01-12 NOTE — Progress Notes (Signed)
Subjective:     Lisa Hodge is a 33 y.o. female who presents for a postpartum visit. She is 8 weeks postpartum following a spontaneous vaginal delivery. I have fully reviewed the prenatal and intrapartum course. The delivery was at [redacted]w[redacted]d gestational weeks. Outcome: spontaneous vaginal delivery. Anesthesia: epidural. Postpartum course has been normal. Baby's course has been normal. Baby is feeding by both breast and bottle - Jerlyn Ly Start Gentle. Bleeding no bleeding. Bowel function is normal. Bladder function is normal. Patient is not sexually active. Contraception method is condoms. Postpartum depression screening: negative.  The following portions of the patient's history were reviewed and updated as appropriate: allergies, current medications, past family history, past medical history, past social history, past surgical history and problem list.  Review of Systems Pertinent items are noted in HPI.   Objective:    BP 114/81   Pulse 83   Temp 98.3 F (36.8 C)   Wt 142 lb 9.6 oz (64.7 kg)   BMI 24.48 kg/m   General:  alert, cooperative and no distress  Lungs: clear to auscultation bilaterally  Heart:  regular rate and rhythm, S1, S2 normal, no murmur, click, rub or gallop  Abdomen: soft, non-tender; bowel sounds normal; no masses,  no organomegaly        Assessment:     normal postpartum exam. Pap smear not done at today's visit.   Plan:    1. Contraception: condoms 2.  Follow up in: 1 year or as needed.

## 2020-08-23 IMAGING — US US MFM OB FOLLOW UP
1 series · 14 of 28 positions shown · non-contrast
Comparison: none

[Series 1: us mfm ob follow up · 14 of 38 slices shown]
[im 2/38]
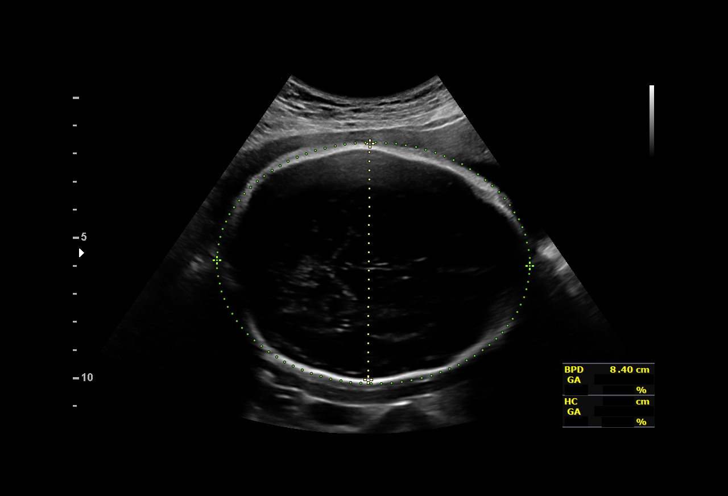
[im 5/38]
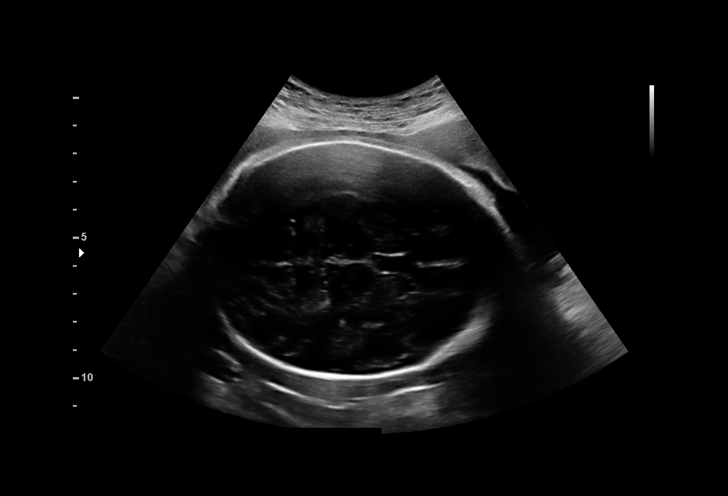
[im 7/38]
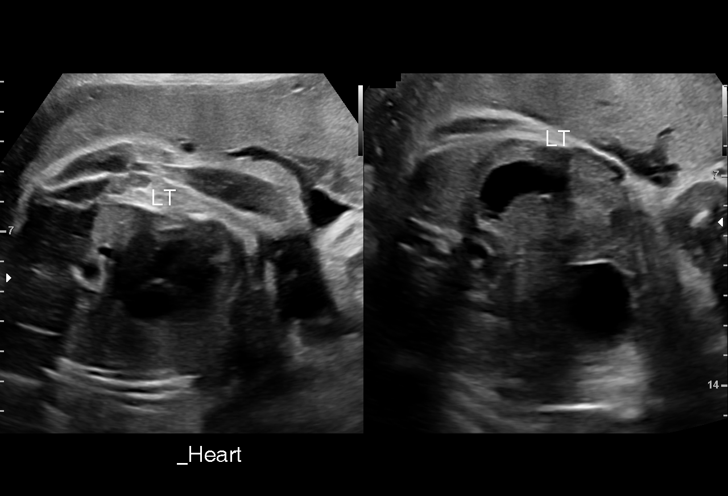
[im 10/38]
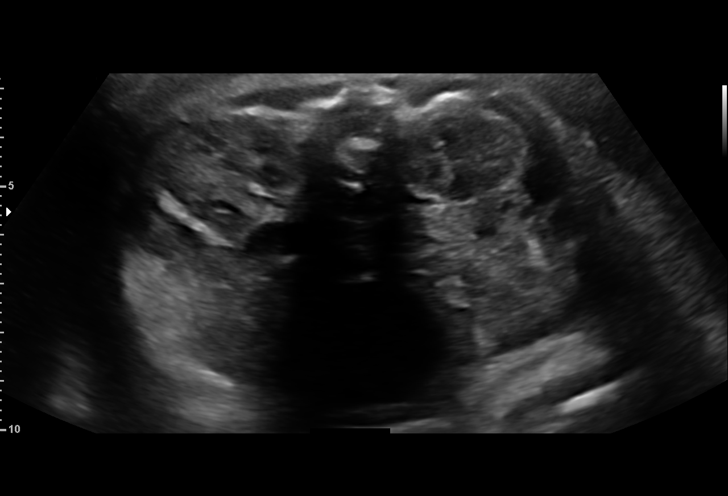
[im 13/38]
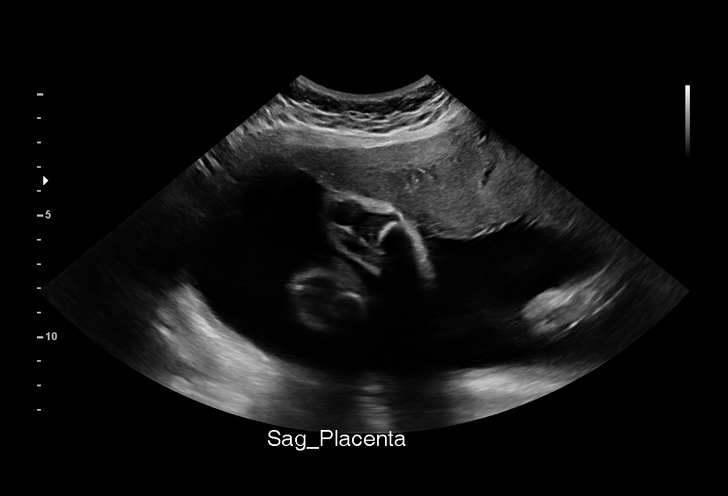
[im 16/38]
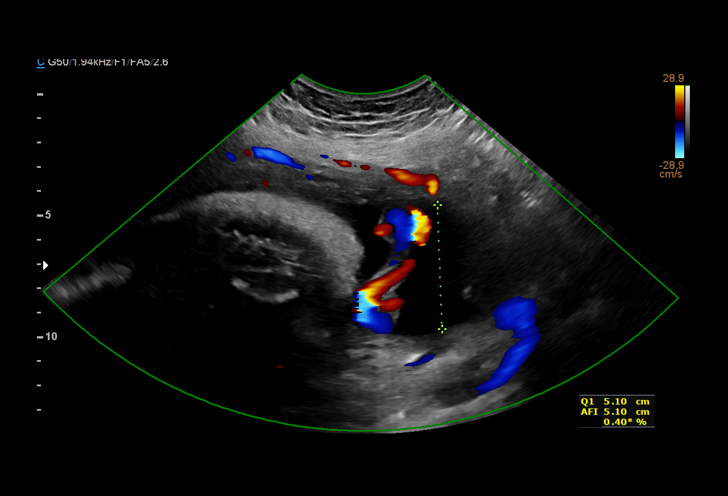
[im 18/38]
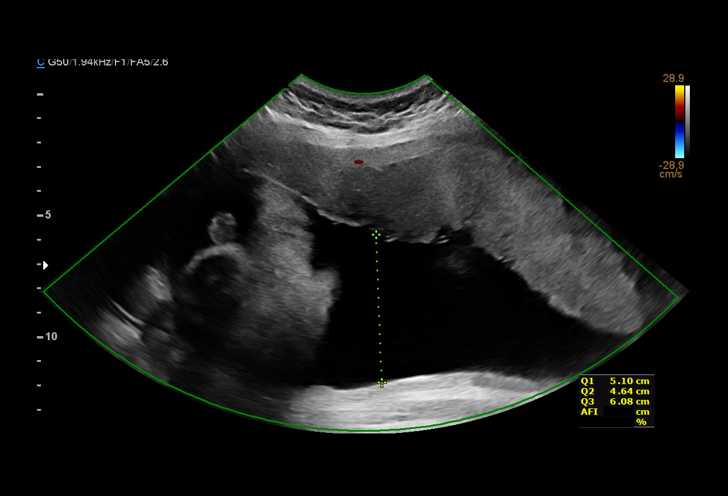
[im 21/38]
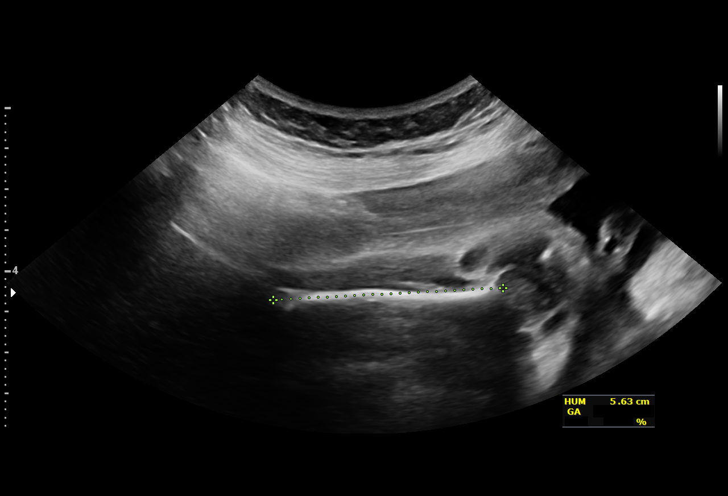
[im 24/38]
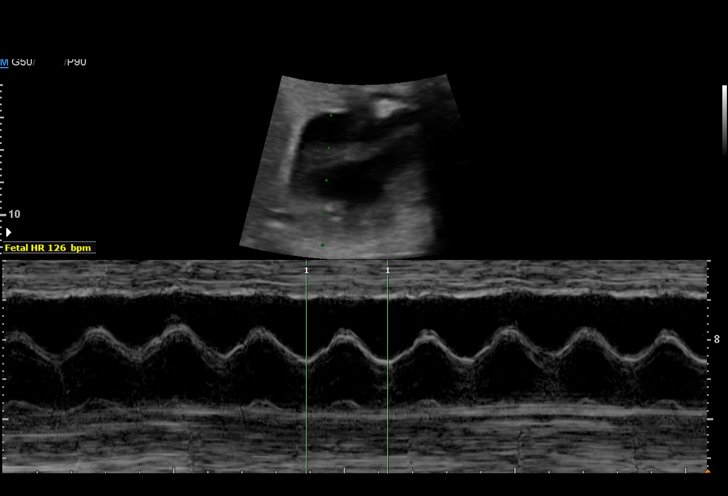
[im 27/38]
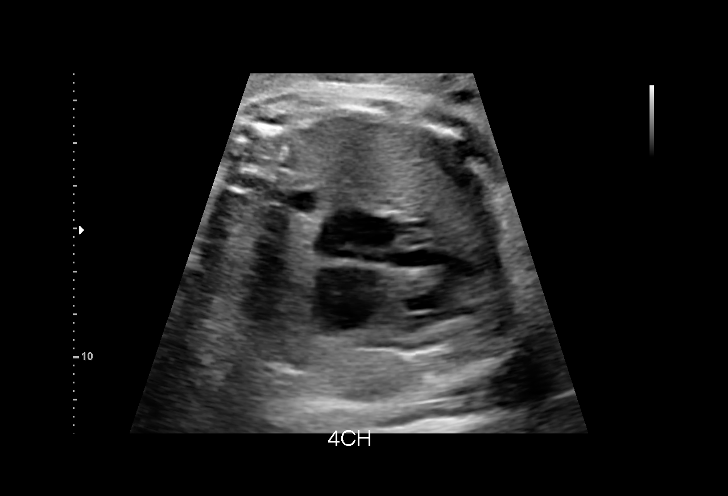
[im 29/38]
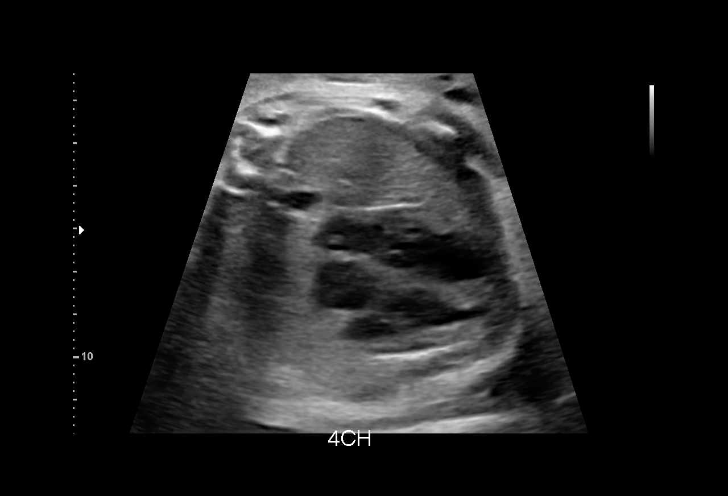
[im 32/38]
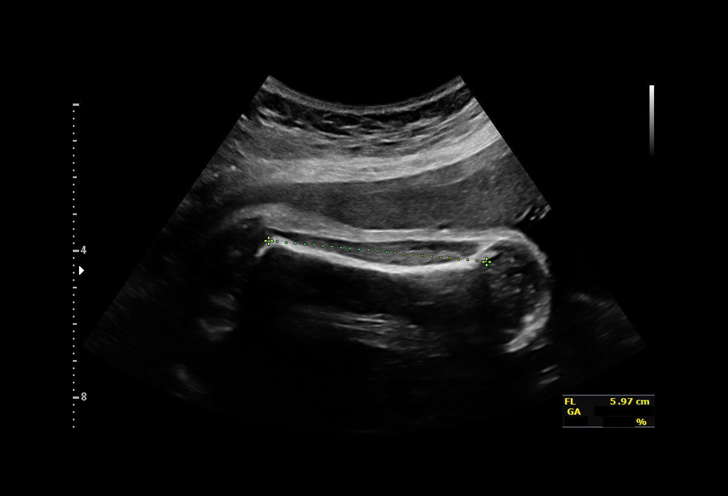
[im 35/38]
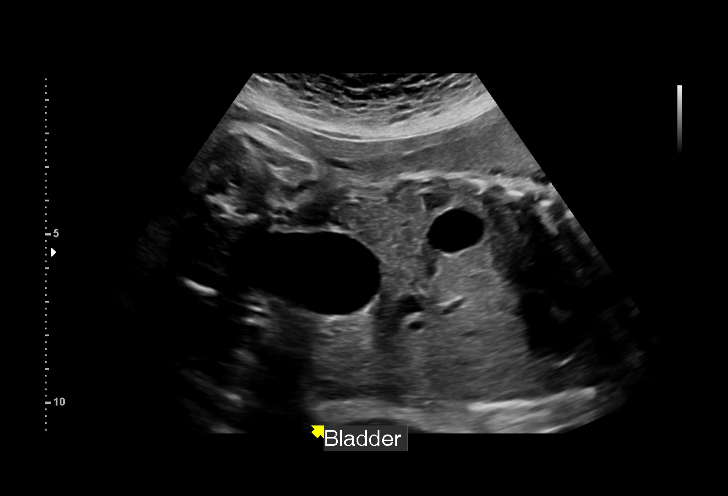
[im 38/38]
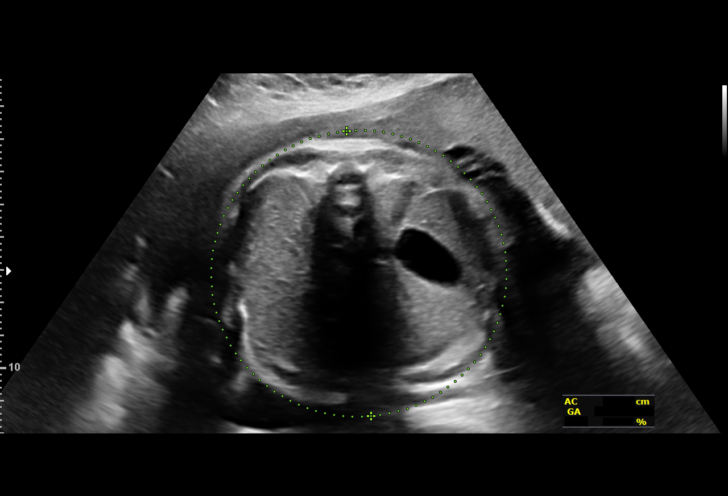

[14 of 28 positions shown; findings below may reference images not displayed]

KINANTI

                   CNM

 ----------------------------------------------------------------------

 ----------------------------------------------------------------------
Indications

  Encounter for other antenatal screening
  follow-up
  Short interval between pregancies, 2nd
  trimester
  Late prenatal care, second trimester
  31 weeks gestation of pregnancy
 ----------------------------------------------------------------------
Vital Signs

 BMI:
Fetal Evaluation

 Num Of Fetuses:          1
 Fetal Heart Rate(bpm):   129
 Cardiac Activity:        Observed
 Presentation:            Cephalic
 Placenta:                Anterior
 P. Cord Insertion:       Visualized

 Amniotic Fluid
 AFI FV:      Within normal limits

 AFI Sum(cm)     %Tile       Largest Pocket(cm)
 15.82           57

 RUQ(cm)       RLQ(cm)       LUQ(cm)        LLQ(cm)
 5.1           0
Biometry

 BPD:        85  mm     G. Age:  34w 1d         95  %    CI:        73.34   %    70 - 86
                                                         FL/HC:       19.2  %    19.1 -
 HC:      315.4  mm     G. Age:  35w 3d         93  %    HC/AC:       1.05       0.96 -
 AC:      300.3  mm     G. Age:  34w 0d         94  %    FL/BPD:      71.2  %    71 - 87
 FL:       60.5  mm     G. Age:  31w 3d         26  %    FL/AC:       20.1  %    20 - 24
 HUM:        55  mm     G. Age:  32w 0d         53  %

 LV:        4.5  mm

 Est. FW:    9535   gm   4 lb 13 oz      80  %
OB History

 Gravidity:    2         Term:   1
Gestational Age

 LMP:           32w 4d        Date:  02/03/18                 EDD:   11/10/18
 U/S Today:     33w 5d                                        EDD:   11/02/18
 Best:          31w 6d     Det. By:  Previous Ultrasound      EDD:   11/15/18
                                     (04/05/18)
Anatomy

 Cranium:               Appears normal         Aortic Arch:            Previously seen
 Cavum:                 Appears normal         Ductal Arch:            Previously seen
 Ventricles:            Appears normal         Diaphragm:              Appears normal
 Choroid Plexus:        Previously seen        Stomach:                Appears normal, left
                                                                       sided
 Cerebellum:            Previously seen        Abdomen:                Appears normal
 Posterior Fossa:       Previously seen        Abdominal Wall:         Previously seen
 Nuchal Fold:           Previously seen        Cord Vessels:           Previously seen
 Face:                  Appears normal         Kidneys:                Appear normal
                        (orbits and profile)
 Lips:                  Previously seen        Bladder:                Appears normal
 Thoracic:              Appears normal         Spine:                  Previously seen
 Heart:                 Appears normal         Upper Extremities:      Previously seen
                        (4CH, axis, and
                        situs)
 RVOT:                  Previously seen        Lower Extremities:      Previously seen
 LVOT:                  Previously seen
Impression

 Normal interval growth.
Recommendations

 Follow up as clinically indicated.
 Delivery by 40-41 weeks if no other risk factors present.

## 2020-09-28 DIAGNOSIS — Z20822 Contact with and (suspected) exposure to covid-19: Secondary | ICD-10-CM | POA: Diagnosis not present

## 2020-09-28 DIAGNOSIS — Z03818 Encounter for observation for suspected exposure to other biological agents ruled out: Secondary | ICD-10-CM | POA: Diagnosis not present
